# Patient Record
Sex: Male | Born: 1960 | Race: Black or African American | Hispanic: No | Marital: Single | Smoking: Never smoker
Health system: Southern US, Community
[De-identification: ages and names within clinical notes are randomized; demographics above are authoritative.]

## PROBLEM LIST (undated history)

## (undated) DIAGNOSIS — G4730 Sleep apnea, unspecified: Secondary | ICD-10-CM

## (undated) DIAGNOSIS — I10 Essential (primary) hypertension: Secondary | ICD-10-CM

## (undated) DIAGNOSIS — I499 Cardiac arrhythmia, unspecified: Secondary | ICD-10-CM

## (undated) HISTORY — DX: Sleep apnea, unspecified: G47.30

## (undated) HISTORY — DX: Cardiac arrhythmia, unspecified: I49.9

## (undated) HISTORY — DX: Essential (primary) hypertension: I10

## (undated) HISTORY — PX: NO PAST SURGERIES: SHX2092

---

## 1999-08-16 ENCOUNTER — Emergency Department (HOSPITAL_COMMUNITY): Admission: EM | Admit: 1999-08-16 | Discharge: 1999-08-16 | Payer: Self-pay | Admitting: Podiatry

## 1999-11-02 ENCOUNTER — Encounter: Payer: Self-pay | Admitting: Chiropractic Medicine

## 1999-11-02 ENCOUNTER — Ambulatory Visit (HOSPITAL_COMMUNITY): Admission: RE | Admit: 1999-11-02 | Discharge: 1999-11-02 | Payer: Self-pay | Admitting: Chiropractic Medicine

## 2003-01-09 ENCOUNTER — Ambulatory Visit (HOSPITAL_COMMUNITY): Admission: RE | Admit: 2003-01-09 | Discharge: 2003-01-09 | Payer: Self-pay | Admitting: Gastroenterology

## 2003-02-28 ENCOUNTER — Encounter: Payer: Self-pay | Admitting: Emergency Medicine

## 2003-02-28 ENCOUNTER — Emergency Department (HOSPITAL_COMMUNITY): Admission: EM | Admit: 2003-02-28 | Discharge: 2003-02-28 | Payer: Self-pay | Admitting: Emergency Medicine

## 2003-03-17 ENCOUNTER — Ambulatory Visit (HOSPITAL_BASED_OUTPATIENT_CLINIC_OR_DEPARTMENT_OTHER): Admission: RE | Admit: 2003-03-17 | Discharge: 2003-03-17 | Payer: Self-pay | Admitting: Surgery

## 2003-03-17 ENCOUNTER — Encounter (INDEPENDENT_AMBULATORY_CARE_PROVIDER_SITE_OTHER): Payer: Self-pay | Admitting: *Deleted

## 2006-01-31 ENCOUNTER — Emergency Department (HOSPITAL_COMMUNITY): Admission: EM | Admit: 2006-01-31 | Discharge: 2006-01-31 | Payer: Self-pay | Admitting: Emergency Medicine

## 2008-11-06 ENCOUNTER — Emergency Department (HOSPITAL_COMMUNITY): Admission: EM | Admit: 2008-11-06 | Discharge: 2008-11-07 | Payer: Self-pay | Admitting: Emergency Medicine

## 2010-11-19 NOTE — Op Note (Signed)
NAME:  [name], [name]                        ACCOUNT NO.:  [id number]   MEDICAL RECORD NO.:  [id number]                   PATIENT TYPE:  AMB   LOCATION:  DSC                                  FACILITY:  [facility]   PHYSICIAN:  Thornton Park. Daphine Deutscher, M.D.             DATE OF BIRTH:  February 15, 1961   DATE OF PROCEDURE:  03/17/2003  DATE OF DISCHARGE:                                 OPERATIVE REPORT   PREOPERATIVE DIAGNOSIS:  Bleeding hemorrhoids.   POSTOPERATIVE DIAGNOSIS:  Internal hemorrhoids.   PROCEDURE:  Exam under anesthesia with excision of 2 hemorrhoidal columns  and banding of 2 hemorrhoidal columns.   SURGEON:  Thornton Park. Daphine Deutscher, M.D.   ANESTHESIA:  General.   DESCRIPTION OF PROCEDURE:  Angel Lyons is a 50 year old man who has had  principally bleeding hemorrhoids. On examination  in the office he had some  internal hemorrhoids but no prolapsed hemorrhoids. Informed consent was  obtained regarding exam under anesthesia with possible banding or excision  of some internal hemorrhoids.   DESCRIPTION OF PROCEDURE:  The patient was placed in the dorsal lithotomy  position after being put to sleep. No prolapsed or extruded hemorrhoids were  noted. However, on entering the  anus with  the anal retractor at the 7 to 8  o'clock position, there was a large internal hemorrhoid that I had seen  previously; it look kind of friable  and like a strawberry.   There were 2, and I went ahead  and grasped the larger one with a  hemorrhoidal clamp and excised the excess and oversewed this with a running  horizontal mattress suture of 3-0 chromic, then sewing back in with a  running locking suture of 3-0 chromic, tying it to itself. I surveyed the  entire anus, and as  I did this I found another column which was at  the 1  to 2 o'clock position, and this  appeared  to be  amenable to banding and  this was double banded with the vacuum assist banding device. Similarly at  the 4 o'clock position   was another area that I was able to double band.   I went back to the 7 o'clock position  and there was still  some redundancy  in another area kind of between  the 7 and 6 o'clock position. I grasped  that with a hemorrhoidal clamp, excised  the excess tissue  and then  oversewed this internal hemorrhoid with a running horizontal mattress suture  of 3-0 chromic with a running locking suture overlying that. Hemostasis was  maintained.   The area of the perianal region was injected with 0.5% Marcaine. Betadine  ointment was massaged into the  anal canal and a light dressing  was  applied. The patient was taken to the recovery room in satisfactory  condition.      MBM/MEDQ  D:  03/17/2003  T:  03/17/2003  Job:  [id number]  

## 2010-11-19 NOTE — Op Note (Signed)
NAME:  Angel Lyons, DOCKEN                        ACCOUNT NO.:  [id number]   MEDICAL RECORD NO.:  [id number]                   PATIENT TYPE:  AMB   LOCATION:  ENDO                                 FACILITY:  [facility]   PHYSICIAN:  Danise Edge, M.D.                DATE OF BIRTH:  11/02/1960   DATE OF PROCEDURE:  01/09/2003  DATE OF DISCHARGE:                                 OPERATIVE REPORT   PROCEDURES:  1. Esophagogastroduodenoscopy.  2. Colonoscopy.   ENDOSCOPIST:  Verlin Grills, M.D.   INDICATIONS FOR PROCEDURE:  Mr. Flynt Breeze is a 50 year old male born  1961-04-22.  Mr. Ahr has iron deficiency anemia based on a  hemoglobin of 9 grams, low serum iron saturation and low serum ferratin.  Mr. Dosher also has intermittent painless hematochezia.   PREMEDICATION:  Versed 10 mg, Demerol 100 mg.   DESCRIPTION OF PROCEDURE:  Esophagogastroduodenoscopy:  After obtaining  informed consent, Mr. Schroeter was placed in the left lateral decubitus  position.  I administered intravenous Demerol and intravenous Versed to  achieve conscious sedation for the procedure.  The patient's blood pressure,  oxygen saturation and cardiac rhythm were monitored throughout the procedure  and documented in the medical record.   The Olympus video gastroscope was passed through the posterior hypopharynx  into the proximal esophagus without difficulty.  The hypopharynx, larynx and  vocal cords appeared normal.   Esophagoscopy: The proximal, mid and lower segments of the esophageal mucosa  appeared normal.   Gastroscopy: Retroflexed view of the gastric cardia and fundus normal.  The  gastric body, antrum, and pylorus appeared normal.   Duodenoscopy: The duodenal bulb, mid duodenum and distal duodenum appeared  normal.  Small bowel biopsies were not performed.   ASSESSMENT:  Normal esophagogastroduodenoscopy.   DESCRIPTION OF PROCEDURE:  Proctocolonoscopy to the cecum:  Anal  inspection  was normal.  The patient was placed in the left lateral decubitus position.  Digital rectal exam was normal.  The Olympus adult colonoscope was  introduced into the rectum and easily advanced to the cecum.  Colonic  preparation for the exam today was satisfactory.   Rectum normal.  Retroflexed view of the distal rectum reveals large but  nonbleeding internal hemorrhoids in multiple columns which is probably the  source of Mr. Hampshire's hematochezia.   Sigmoid colon and descending colon:  Normal.   Splenic flexure normal.   Transverse colon normal.   Hepatic flexure normal.   Ascending colon normal.   Cecum and ileocecal valve normal.   ASSESSMENT:  Large internal hemorrhoids; otherwise normal proctocolonoscopy  to the cecum.  No active lower gastrointestinal bleeding.   RECOMMENDATIONS:  1. Place Mr. Cargo on iron sulfate 325 mg daily for three months.  2. Check his reticulocyte count, hemoglobin and ferratin at 6 and 12 weeks     to assess his response to oral iron and the  ability to absorb oral iron.  3. He would also be a good candidate for referral to Dr. Danna Hefty     for internal hemorrhoid management.                                               Danise Edge, M.D.    MJ/MEDQ  D:  01/09/2003  T:  01/10/2003  Job:  [id number]   cc:   Donia Guiles, M.D.  [address] [place]  [place] [address]  Fax: [phone]

## 2011-10-13 ENCOUNTER — Encounter (HOSPITAL_BASED_OUTPATIENT_CLINIC_OR_DEPARTMENT_OTHER): Payer: Self-pay

## 2011-10-24 ENCOUNTER — Ambulatory Visit (HOSPITAL_BASED_OUTPATIENT_CLINIC_OR_DEPARTMENT_OTHER): Payer: BC Managed Care – PPO | Attending: Internal Medicine | Admitting: Radiology

## 2011-10-24 VITALS — Ht 71.5 in | Wt 250.0 lb

## 2011-10-24 DIAGNOSIS — I491 Atrial premature depolarization: Secondary | ICD-10-CM | POA: Insufficient documentation

## 2011-10-24 DIAGNOSIS — I4949 Other premature depolarization: Secondary | ICD-10-CM | POA: Insufficient documentation

## 2011-10-24 DIAGNOSIS — G4733 Obstructive sleep apnea (adult) (pediatric): Secondary | ICD-10-CM | POA: Insufficient documentation

## 2011-10-29 DIAGNOSIS — G4733 Obstructive sleep apnea (adult) (pediatric): Secondary | ICD-10-CM

## 2011-10-29 DIAGNOSIS — I4949 Other premature depolarization: Secondary | ICD-10-CM

## 2011-10-29 DIAGNOSIS — I491 Atrial premature depolarization: Secondary | ICD-10-CM

## 2011-10-29 NOTE — Procedures (Signed)
NAME:  Angel Lyons, CENTOLA              ACCOUNT NO.:  [id number]  MEDICAL RECORD NO.:  [id number]          PATIENT TYPE:  OUT  LOCATION:  SLEEP CENTER                 [facility]ACILITY:  [facility]  PHYSICIAN:  Loni Delbridge D. Maple Hudson, MD, FCCP, FACPDATE OF BIRTH:  December 03, 1960  DATE OF STUDY:  10/24/2011                           NOCTURNAL POLYSOMNOGRAM  REFERRING PHYSICIAN:  Margaretmary Bayley, M.D.  REFERRING PHYSICIAN:  Margaretmary Bayley, MD  INDICATION FOR STUDY:  Hypersomnia with sleep apnea.  EPWORTH SLEEPINESS SCORE:  15/24.  BMI 34.4, weight 250 pounds, height 71.5 inches, neck 16.5 inches.  MEDICATIONS:  Home medications are charted and reviewed.  SLEEP ARCHITECTURE:  Split study protocol.  During the diagnostic phase, total sleep time 125 minutes with sleep efficiency 80.1%.  Stage I was 24%, stage II 76%, stages 3 and REM were absent.  Sleep latency 20 minutes, awake after sleep onset 10.5 minutes, and arousal index 99.8.  Bedtime medication:  None.  RESPIRATORY DATA:  Split study protocol.  Apnea-hypopnea index (AHI) 96 per hour.  A total of 200 events was scored including 48 obstructive apneas and 38 mixed apneas with 114 hypopneas.  Events were associated with supine sleep position.  CPAP was then titrated to 19 CWP, AHI 0 per hour wore.  He wore a medium ResMed Mirage Quattro full face mask with heated humidifier and a C-Flex setting of 3.  OXYGEN DATA:  Before CPAP, snoring was moderately loud with oxygen desaturation to a nadir of 71% on room air.  With CPAP titration, snoring was prevented and mean oxygen saturation held 91.8% on room air.  CARDIAC DATA:  Sinus rhythm with frequent PACs and multifocal PVCs.  MOVEMENT-PARASOMNIA:  No significant movement disturbance.  No bathroom trips.  IMPRESSIONS-RECOMMENDATION: 1. Severe obstructive sleep apnea/hypopnea syndrome, apnea-hypopnea     index 96 per hour with supine events.  Moderately loud snoring with     oxygen desaturation to a nadir  of 71% on room air. 2. Successful continuous positive airway pressure titration to 19 cm     of water pressure, apnea-hypopnea index 0 per hour.  He wore a     medium ResMed Mirage Quattro full face mask with heated humidifier     and a C-Flex     setting of 3. 3. Frequent premature atrial contractions and multifocal premature     ventricular contractions.     Ormand Senn D. Maple Hudson, MD, [facility], FACP Diplomate, American Board of Sleep Medicine    CDY/MEDQ  D:  10/29/2011 12:42:55  T:  10/29/2011 13:27:24  Job:  [id number]

## 2012-10-03 ENCOUNTER — Institutional Professional Consult (permissible substitution): Payer: BC Managed Care – PPO | Admitting: Pulmonary Disease

## 2012-10-05 ENCOUNTER — Encounter: Payer: Self-pay | Admitting: Pulmonary Disease

## 2012-10-11 ENCOUNTER — Telehealth: Payer: Self-pay

## 2012-10-11 NOTE — Telephone Encounter (Signed)
I spoke with Marcelino Duster. [facility] had an opening tomorrow at 11:15. Pt is scheduled for that time. She is aware pt to arrive 15 minutes early. She is aware of location. She will fax paperwork to triage fax #. Nothing further was needed

## 2012-10-12 ENCOUNTER — Ambulatory Visit (INDEPENDENT_AMBULATORY_CARE_PROVIDER_SITE_OTHER): Payer: Private Health Insurance - Indemnity | Admitting: Pulmonary Disease

## 2012-10-12 ENCOUNTER — Encounter: Payer: Self-pay | Admitting: Pulmonary Disease

## 2012-10-12 VITALS — BP 130/88 | HR 97 | Temp 98.3°F | Ht 71.5 in | Wt 244.2 lb

## 2012-10-12 DIAGNOSIS — G4733 Obstructive sleep apnea (adult) (pediatric): Secondary | ICD-10-CM

## 2012-10-12 NOTE — Progress Notes (Signed)
Subjective:    Patient ID: Angel Lyons, male    DOB: 13-Feb-1961, 52 y.o.   MRN: [id number]  HPI The patient is a 52 year old male who been asked to see for management of obstructive sleep apnea.  He was diagnosed with very severe sleep apnea in 2013, with an AHI of 96 events per hour and oxygen desaturation as low as 71%.  He was titrated on CPAP to an optimal pressure of 19 cm of water.  Currently, the patient states that he does have loud snoring, and has been told that he has an abnormal breathing pattern during sleep.  He also notes occasional gasping arousals.  He has frequent awakenings at night, and has not rested in the mornings upon arising.  He notes definite sleep pressure during the day with inactivity, and falls asleep easily in the evenings watching television.  He also notes some sleepiness with driving long distances.  The patient's weight is down 20 pounds over the last 2 years, and his epworth score today is 10.   Sleep Questionnaire What time do you typically go to bed?( Between what hours) 9p-11p ------sometime works split shift > into work at [employer] back in at 1pm-3:30p 9p-11p ------sometime works split shift > into work at [employer] back in at 1pm-3:30p at 1121 on 10/12/12 by Nita Sells, CMA How long does it take you to fall asleep? 5-56mins 5-72mins at 1121 on 10/12/12 by Nita Sells, CMA How many times during the night do you wake up? 2 2 at 1121 on 10/12/12 by Nita Sells, CMA What time do you get out of bed to start your day? 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 (In pounds) 20 lb (9.072 kg)20 lb (9.072 kg) decrease at 1121 on 10/12/12 by Nita Sells, CMA Have you ever had a sleep study before? Yes Yes at 1121 on  10/12/12 by Nita Sells, CMA If yes, location of study? [facility] at 1121 on 10/12/12 by Nita Sells, CMA If yes, date of study? 10/24/2011 10/24/2011 at 1121 on 10/12/12 by Nita Sells, CMA Do you currently use CPAP? No No at 1121 on 10/12/12 by Marjo Bicker Mabe, CMA Do you wear oxygen at any time? No No at 1121 on 10/12/12 by Marjo Bicker Mabe, CMA   Review of Systems  Constitutional: Negative for fever and unexpected weight change.  HENT: Positive for congestion, rhinorrhea and postnasal drip. Negative for ear pain, nosebleeds, sore throat, sneezing, trouble swallowing, dental problem and sinus pressure.   Eyes: Negative for redness and itching.  Respiratory: Positive for cough and shortness of breath ( happens with irreg heartbeats//palps). Negative for chest tightness and wheezing.   Cardiovascular: Positive for palpitations ( causing SOB). Negative for leg swelling.  Gastrointestinal: Negative for nausea and vomiting.  Genitourinary: Negative for dysuria.  Musculoskeletal: Negative for joint swelling.  Skin: Negative for rash.  Neurological: Negative for headaches.  Hematological: Does not bruise/bleed easily.  Psychiatric/Behavioral: Negative for dysphoric mood. The patient is not nervous/anxious.        Objective:   Physical Exam Constitutional: overweight male,  no acute distress  HENT:  Nares patent without discharge  Oropharynx without exudate, palate and uvula are moderately elongated.   Eyes:  Perrla, eomi, no scleral icterus  Neck:  No JVD, no TMG  Cardiovascular:  Normal rate, regular rhythm, no rubs or gallops.  No murmurs        Intact distal pulses  Pulmonary :  Normal breath sounds, no stridor or respiratory distress   No rales, rhonchi, or wheezing  Abdominal:  Soft, nondistended, bowel sounds present.  No tenderness noted.   Musculoskeletal:  No lower extremity edema noted.  Lymph Nodes:  No cervical lymphadenopathy noted  Skin:  No  cyanosis noted  Neurologic:  Alert, appropriate, moves all 4 extremities without obvious deficit.         Assessment & Plan:

## 2012-10-12 NOTE — Assessment & Plan Note (Signed)
The patient has been diagnosed with very severe obstructive sleep apnea, and will need aggressive treatment while he is working on weight loss.  I had long discussion with him about sleep apnea, including its impact to his quality of life and cardiovascular health.  I would like to start him on CPAP and a moderate pressure level, and then gradually increase him to his optimal level.  I also stressed to him the importance of weight loss.

## 2012-10-12 NOTE — Patient Instructions (Addendum)
Will get you started on cpap at a moderate level.  Please call me if you are having issues with tolerance. Work on weight loss followup with me in 6 weeks.

## 2016-02-07 ENCOUNTER — Encounter (HOSPITAL_COMMUNITY): Payer: Self-pay | Admitting: *Deleted

## 2016-02-07 ENCOUNTER — Emergency Department (HOSPITAL_COMMUNITY)
Admission: EM | Admit: 2016-02-07 | Discharge: 2016-02-07 | Disposition: A | Discharge status: Home / Self Care | Payer: Private Health Insurance - Indemnity | Attending: Emergency Medicine | Admitting: Emergency Medicine

## 2016-02-07 DIAGNOSIS — I10 Essential (primary) hypertension: Secondary | ICD-10-CM | POA: Insufficient documentation

## 2016-02-07 DIAGNOSIS — R519 Headache, unspecified: Secondary | ICD-10-CM

## 2016-02-07 DIAGNOSIS — Z79899 Other long term (current) drug therapy: Secondary | ICD-10-CM | POA: Insufficient documentation

## 2016-02-07 DIAGNOSIS — R51 Headache: Secondary | ICD-10-CM | POA: Insufficient documentation

## 2016-02-07 MED ORDER — ACETAMINOPHEN 325 MG PO TABS
975.0000 mg | ORAL_TABLET | Freq: Once | ORAL | Status: AC
  Administered 2016-02-07: 975 mg via ORAL
  Filled 2016-02-07: qty 3

## 2016-02-07 MED ORDER — IBUPROFEN 400 MG PO TABS
800.0000 mg | ORAL_TABLET | Freq: Once | ORAL | Status: AC
  Administered 2016-02-07: 800 mg via ORAL
  Filled 2016-02-07: qty 2

## 2016-02-07 NOTE — ED Triage Notes (Signed)
Pt reports having a headache this and is requesting tylenol, reports being homeless and unable to afford tylenol. Denies n/v.

## 2016-02-07 NOTE — ED Notes (Signed)
C/O BILATERAL TEMPORAL HEADACHE, ONSET THIS AM. DENIES N/V OR VISUAL DISTURBANCES. STATES IS HOMELESS AND DOESN'T HAVE MONEY TO BUY TYLENOL.

## 2016-02-07 NOTE — ED Provider Notes (Signed)
[facility]EMERGENCY DEPT Provider Note   CSN: [id number] Arrival date & time: 02/07/16  1206  First Provider Contact: First MD Initiated Contact with Patient 02/07/16 1213    By signing my name below, I, Rosario Adie, attest that this documentation has been prepared under the direction and in the presence of [employer], PA-C.  Electronically Signed: Rosario Adie, ED Scribe. 02/07/16. 12:23 PM.  History   Chief Complaint Chief Complaint  Patient presents with  . Headache   HPI HPI Comments: Angel Lyons is a 55 y.o. male who is homeless, with a PMHx significant of HTN, who presents to the Emergency Department complaining of gradual onset, unchanged, constant, 8/10 bilateral temporal headache onset this AM PTA. Pt reports to the ED asking for Tylenol, and states that he cannot afford any due to homelessness. No exacerbated or alleviating factors noted. Denies nausea, vomiting, fever, neck pain, balance issues, gait abnormalities or any other associated symptoms.  Past Medical History:  Diagnosis Date  . Abnormal heart rhythm   . HTN (hypertension)   . Sleep apnea    Patient Active Problem List   Diagnosis Date Noted  . OSA (obstructive sleep apnea) 10/12/2012   Past Surgical History:  Procedure Laterality Date  . NO PAST SURGERIES      Home Medications    Prior to Admission medications   Medication Sig Start Date End Date Taking? Authorizing Provider  amLODipine-olmesartan (AZOR) 5-40 MG per tablet Take 1 tablet by mouth daily.    Historical Provider, MD   Family History No family history on file.  Social History Social History  Substance Use Topics  . Smoking status: Never Smoker  . Smokeless tobacco: Not on file  . Alcohol use No   Allergies   Review of patient's allergies indicates not on file.  Review of Systems Review of Systems A complete 10 system review of systems was obtained and all systems are negative except as noted in the HPI and  PMH.   Physical Exam Updated Vital Signs BP 149/91 (BP Location: Left Arm)   Pulse 65   Temp 98.7 F (37.1 C) (Oral)   Resp 22   SpO2 100%   Physical Exam  Constitutional: He is oriented to person, place, and time. He appears well-developed and well-nourished.  HENT:  Head: Normocephalic and atraumatic.  Mouth/Throat: Oropharynx is clear and moist.  Eyes: Conjunctivae and EOM are normal. Pupils are equal, round, and reactive to light.  No TTP of maxillary or frontal sinuses  No TTP or induration of temporal arteries bilaterally  Neck: Normal range of motion. Neck supple.  FROM to C-spine. Pt can touch chin to chest without discomfort. No TTP of midline cervical spine.   Cardiovascular: Normal rate, regular rhythm and intact distal pulses.   Pulmonary/Chest: Effort normal and breath sounds normal. No respiratory distress. He has no wheezes. He has no rales. He exhibits no tenderness.  Abdominal: Soft. Bowel sounds are normal. He exhibits no distension. There is no tenderness.  Musculoskeletal: Normal range of motion. He exhibits no edema or tenderness.  Neurological: He is alert and oriented to person, place, and time. No cranial nerve deficit.  II-Visual fields grossly intact. III/IV/VI-Extraocular movements intact.  Pupils reactive bilaterally. V/VII-Smile symmetric, equal eyebrow raise,  facial sensation intact VIII- Hearing grossly intact IX/X-Normal gag XI-bilateral shoulder shrug XII-midline tongue extension Motor: 5/5 bilaterally with normal tone and bulk Cerebellar: Normal finger-to-nose  and normal heel-to-shin test.   Romberg negative Ambulates with a  coordinated gait   Skin: Skin is warm and dry.  Psychiatric: He has a normal mood and affect. His behavior is normal.  Nursing note and vitals reviewed.  ED Treatments / Results  DIAGNOSTIC STUDIES: Oxygen Saturation is 100% on RA, normal by my interpretation.   COORDINATION OF CARE: 12:23 PM-Discussed next  steps with pt. Pt verbalized understanding and is agreeable with the plan.   Procedures Procedures   Medications Ordered in ED Medications  acetaminophen (TYLENOL) tablet 975 mg (975 mg Oral Given 02/07/16 1231)  ibuprofen (ADVIL,MOTRIN) tablet 800 mg (800 mg Oral Given 02/07/16 1231)   Initial Impression / Assessment and Plan / ED Course  I have reviewed the triage vital signs and the nursing notes.  Pertinent labs & imaging results that were available during my care of the patient were reviewed by me and considered in my medical decision making (see chart for details).  Clinical Course    Vitals:   02/07/16 1212  BP: 149/91  Pulse: 65  Resp: 22  Temp: 98.7 F (37.1 C)  TempSrc: Oral  SpO2: 100%    Medications  acetaminophen (TYLENOL) tablet 975 mg (975 mg Oral Given 02/07/16 1231)  ibuprofen (ADVIL,MOTRIN) tablet 800 mg (800 mg Oral Given 02/07/16 1231)    Angel Lyons is 55 y.o. male presenting with Headache, normal neuro exam, no red flags. Patient states that he is homeless and cannot afford over-the-counter medications.  Evaluation does not show pathology that would require ongoing emergent intervention or inpatient treatment. Pt is hemodynamically stable and mentating appropriately. Discussed findings and plan with patient/guardian, who agrees with care plan. All questions answered. Return precautions discussed and outpatient follow up given.      Final Clinical Impressions(s) / ED Diagnoses   Final diagnoses:  Acute nonintractable headache, unspecified headache type    New Prescriptions New Prescriptions   No medications on file   I personally performed the services described in this documentation, which was scribed in my presence. The recorded information has been reviewed and is accurate.     Wynetta Emeryicole Burlin Mcnair, PA-C 02/07/16 1302    Vanetta MuldersScott Zackowski, MD 02/07/16 1455

## 2016-02-07 NOTE — Discharge Instructions (Signed)
Please follow with your primary care doctor in the next 2 days for a check-up. They must obtain records for further management.  ° °Do not hesitate to return to the Emergency Department for any new, worsening or concerning symptoms.  ° °

## 2016-02-25 ENCOUNTER — Encounter (HOSPITAL_COMMUNITY): Payer: Self-pay | Admitting: Emergency Medicine

## 2016-02-25 ENCOUNTER — Emergency Department (HOSPITAL_COMMUNITY)
Admission: EM | Admit: 2016-02-25 | Discharge: 2016-02-25 | Disposition: A | Discharge status: Home / Self Care | Payer: BLUE CROSS/BLUE SHIELD | Attending: Emergency Medicine | Admitting: Emergency Medicine

## 2016-02-25 DIAGNOSIS — I10 Essential (primary) hypertension: Secondary | ICD-10-CM | POA: Diagnosis not present

## 2016-02-25 DIAGNOSIS — S8991XA Unspecified injury of right lower leg, initial encounter: Secondary | ICD-10-CM | POA: Diagnosis present

## 2016-02-25 DIAGNOSIS — Y999 Unspecified external cause status: Secondary | ICD-10-CM | POA: Insufficient documentation

## 2016-02-25 DIAGNOSIS — Y929 Unspecified place or not applicable: Secondary | ICD-10-CM | POA: Insufficient documentation

## 2016-02-25 DIAGNOSIS — S8391XA Sprain of unspecified site of right knee, initial encounter: Secondary | ICD-10-CM | POA: Diagnosis not present

## 2016-02-25 DIAGNOSIS — X509XXA Other and unspecified overexertion or strenuous movements or postures, initial encounter: Secondary | ICD-10-CM | POA: Insufficient documentation

## 2016-02-25 DIAGNOSIS — Y939 Activity, unspecified: Secondary | ICD-10-CM | POA: Insufficient documentation

## 2016-02-25 DIAGNOSIS — S43421A Sprain of right rotator cuff capsule, initial encounter: Secondary | ICD-10-CM | POA: Insufficient documentation

## 2016-02-25 MED ORDER — IBUPROFEN 400 MG PO TABS
600.0000 mg | ORAL_TABLET | Freq: Once | ORAL | Status: AC
  Administered 2016-02-25: 600 mg via ORAL
  Filled 2016-02-25: qty 1

## 2016-02-25 NOTE — ED Provider Notes (Signed)
[facility] Provider Note   CSN: [id number] Arrival date & time: 02/25/16  [id number]     History   Chief Complaint Chief Complaint  Patient presents with  . Shoulder Pain  . Knee Pain    HPI Angel Lyons is a 55 y.o. male.  HPI  55 y.o. male with a hx of HTN, presents to the Emergency Department today complaining of right shoulder pain and right knee pain. Notes right shoulder pain with onset yesterday. 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 No trauma to area. No falls. As patient was exiting bus, his right knee "buckled"  With no trauma or falls. Notes pain 9/10 on both. Has not tried any OTC remedies. No N/V. No fevers. No other symptoms noted.   Past Medical History:  Diagnosis Date  . Abnormal heart rhythm   . HTN (hypertension)   . Sleep apnea     Patient Active Problem List   Diagnosis Date Noted  . OSA (obstructive sleep apnea) 10/12/2012    Past Surgical History:  Procedure Laterality Date  . NO PAST SURGERIES       Home Medications    Prior to Admission medications   Medication Sig Start Date End Date Taking? Authorizing Provider  amLODipine-olmesartan (AZOR) 5-40 MG per tablet Take 1 tablet by mouth daily.    Historical Provider, MD    Family History No family history on file.  Social History Social History  Substance Use Topics  . Smoking status: Never Smoker  . Smokeless tobacco: Never Used  . Alcohol use No     Allergies   Review of patient's allergies indicates no known allergies.   Review of Systems Review of Systems  Constitutional: Negative for fever.  Gastrointestinal: Negative for nausea.  Musculoskeletal: Positive for arthralgias.   Physical Exam Updated Vital Signs BP 148/86 (BP Location: Right Arm)   Pulse 88   Temp 98 F (36.7 C)   Resp 19   Ht 5' 11.5" (1.816 m)   Wt 105.2 kg   SpO2 97%   BMI 31.91 kg/m   Physical Exam  Constitutional: He is oriented to person, place, and time.  Vital signs are normal. He appears well-developed and well-nourished.  HENT:  Head: Normocephalic.  Right Ear: Hearing normal.  Left Ear: Hearing normal.  Eyes: Conjunctivae and EOM are normal. Pupils are equal, round, and reactive to light.  Cardiovascular: Normal rate and regular rhythm.   Pulmonary/Chest: Effort normal.  Musculoskeletal:       Right shoulder: Normal. He exhibits normal range of motion.       Right knee: Normal.  Right Shoulder Negative hawkins test, negative Neer's test, no TTP over shoulder or elbow. No pain with flexion/extension/abduction/adduction internal or external rotation. No obvious bony deformity. Right knee Negative anterior/poster drawer bilaterally. Negative ballottement test. No varus or valgus laxity. No crepitus. No pain with flexion or extension. No TTP of knees or ankles.   Neurological: He is alert and oriented to person, place, and time.  Skin: Skin is warm and dry.  Psychiatric: He has a normal mood and affect. His speech is normal and behavior is normal. Thought content normal.   ED Treatments / Results  Labs (all labs ordered are listed, but only abnormal results are displayed) Labs Reviewed - No data to display  EKG  EKG Interpretation None      Radiology No results found.  Procedures Procedures (including critical care time)  Medications Ordered in ED Medications -  No data to display   Initial Impression / Assessment and Plan / ED Course  I have reviewed the triage vital signs and the nursing notes.  Pertinent labs & imaging results that were available during my care of the patient were reviewed by me and considered in my medical decision making (see chart for details).  Clinical Course    Final Clinical Impressions(s) / ED Diagnoses  I have reviewed the relevant previous healthcare records. I obtained HPI from historian.  ED Course:  Assessment: Pt is a 55yM who presents with right shoulder and right knee pain. On  exam, pt in NAD. Nontoxic/nonseptic appearing. VSS. Afebrile. Likely sprains of respective areas. No trauma or fall to areas. Pain only with movement. Will DC with knee sleeve for sprain of knee and counseled on rotator cuff exercises. Counseled on NSAID use. Imaging not warranted. I do not suspect fracture. Plan is to DC home with follow up to PCP. At time of discharge, Patient is in no acute distress. Vital Signs are stable. Patient is able to ambulate. Patient able to tolerate PO.   Disposition/Plan:  DC Home Additional Verbal discharge instructions given and discussed with patient.  Pt Instructed to f/u with PCP in the next week for evaluation and treatment of symptoms. Return precautions given Pt acknowledges and agrees with plan  Supervising Physician Tomasita CrumbleAdeleke Oni, MD   Final diagnoses:  Rotator cuff sprain, right, initial encounter  Right knee sprain, initial encounter    New Prescriptions New Prescriptions   No medications on file     Audry Piliyler Lauriann Milillo, PA-C 02/25/16 0446    Tomasita CrumbleAdeleke Oni, MD 02/25/16 [phone]
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
## 2016-02-27 ENCOUNTER — Emergency Department (HOSPITAL_COMMUNITY): Payer: BLUE CROSS/BLUE SHIELD

## 2016-02-27 ENCOUNTER — Encounter (HOSPITAL_COMMUNITY): Payer: Self-pay | Admitting: Emergency Medicine

## 2016-02-27 ENCOUNTER — Emergency Department (HOSPITAL_COMMUNITY)
Admission: EM | Admit: 2016-02-27 | Discharge: 2016-02-27 | Disposition: A | Discharge status: Home / Self Care | Payer: BLUE CROSS/BLUE SHIELD | Attending: Emergency Medicine | Admitting: Emergency Medicine

## 2016-02-27 DIAGNOSIS — M25561 Pain in right knee: Secondary | ICD-10-CM | POA: Insufficient documentation

## 2016-02-27 DIAGNOSIS — I10 Essential (primary) hypertension: Secondary | ICD-10-CM | POA: Diagnosis not present

## 2016-02-27 DIAGNOSIS — Y939 Activity, unspecified: Secondary | ICD-10-CM | POA: Insufficient documentation

## 2016-02-27 DIAGNOSIS — S46911A Strain of unspecified muscle, fascia and tendon at shoulder and upper arm level, right arm, initial encounter: Secondary | ICD-10-CM

## 2016-02-27 DIAGNOSIS — Y999 Unspecified external cause status: Secondary | ICD-10-CM | POA: Diagnosis not present

## 2016-02-27 DIAGNOSIS — S4991XA Unspecified injury of right shoulder and upper arm, initial encounter: Secondary | ICD-10-CM | POA: Diagnosis present

## 2016-02-27 DIAGNOSIS — X58XXXA Exposure to other specified factors, initial encounter: Secondary | ICD-10-CM | POA: Diagnosis not present

## 2016-02-27 DIAGNOSIS — Y929 Unspecified place or not applicable: Secondary | ICD-10-CM | POA: Diagnosis not present

## 2016-02-27 DIAGNOSIS — S46011A Strain of muscle(s) and tendon(s) of the rotator cuff of right shoulder, initial encounter: Secondary | ICD-10-CM | POA: Insufficient documentation

## 2016-02-27 MED ORDER — ACETAMINOPHEN 500 MG PO TABS
1000.0000 mg | ORAL_TABLET | Freq: Once | ORAL | Status: AC
  Administered 2016-02-27: 1000 mg via ORAL
  Filled 2016-02-27: qty 2

## 2016-02-27 MED ORDER — IBUPROFEN 800 MG PO TABS
800.0000 mg | ORAL_TABLET | Freq: Once | ORAL | Status: AC
  Administered 2016-02-27: 800 mg via ORAL
  Filled 2016-02-27: qty 1
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
## 2016-03-02 ENCOUNTER — Encounter (HOSPITAL_COMMUNITY): Payer: Self-pay | Admitting: Emergency Medicine

## 2016-03-02 ENCOUNTER — Emergency Department (HOSPITAL_COMMUNITY)
Admission: EM | Admit: 2016-03-02 | Discharge: 2016-03-02 | Disposition: A | Discharge status: Home / Self Care | Payer: BLUE CROSS/BLUE SHIELD | Attending: Emergency Medicine | Admitting: Emergency Medicine

## 2016-03-02 DIAGNOSIS — Y929 Unspecified place or not applicable: Secondary | ICD-10-CM | POA: Insufficient documentation

## 2016-03-02 DIAGNOSIS — S46911D Strain of unspecified muscle, fascia and tendon at shoulder and upper arm level, right arm, subsequent encounter: Secondary | ICD-10-CM

## 2016-03-02 DIAGNOSIS — I10 Essential (primary) hypertension: Secondary | ICD-10-CM | POA: Insufficient documentation

## 2016-03-02 DIAGNOSIS — M25561 Pain in right knee: Secondary | ICD-10-CM | POA: Diagnosis not present

## 2016-03-02 DIAGNOSIS — M25511 Pain in right shoulder: Secondary | ICD-10-CM | POA: Diagnosis not present

## 2016-03-02 DIAGNOSIS — Y939 Activity, unspecified: Secondary | ICD-10-CM | POA: Diagnosis not present

## 2016-03-02 DIAGNOSIS — Y990 Civilian activity done for income or pay: Secondary | ICD-10-CM | POA: Diagnosis not present

## 2016-03-02 DIAGNOSIS — X58XXXA Exposure to other specified factors, initial encounter: Secondary | ICD-10-CM | POA: Diagnosis not present

## 2016-03-02 MED ORDER — IBUPROFEN 800 MG PO TABS
800.0000 mg | ORAL_TABLET | Freq: Once | ORAL | Status: AC
  Administered 2016-03-02: 800 mg via ORAL
  Filled 2016-03-02: qty 1

## 2016-03-02 NOTE — ED Notes (Signed)
Pt provided with d/c instructions at this time. Pt verbalizes understanding of d/c instructions as well as follow up procedure after d/c. No new RX at time of d/c.  Pt ambulatory at time of d/c. Pt in no apparent distress at this time.   

## 2016-03-02 NOTE — ED Triage Notes (Signed)
Pt reports that he injured his right shoulder and knee while working on his job.  Pt reports no relief of symptoms since then.   Chief Complaint  Patient presents with  . Knee Pain  . Shoulder Pain   Past Medical History:  Diagnosis Date  . Abnormal heart rhythm   . HTN (hypertension)   . Sleep apnea
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
## 2016-03-03 ENCOUNTER — Encounter (HOSPITAL_COMMUNITY): Payer: Self-pay | Admitting: *Deleted

## 2016-03-03 ENCOUNTER — Emergency Department (HOSPITAL_COMMUNITY)
Admission: EM | Admit: 2016-03-03 | Discharge: 2016-03-03 | Disposition: A | Discharge status: Home / Self Care | Payer: BLUE CROSS/BLUE SHIELD | Attending: Dermatology | Admitting: Dermatology

## 2016-03-03 DIAGNOSIS — Z5321 Procedure and treatment not carried out due to patient leaving prior to being seen by health care provider: Secondary | ICD-10-CM | POA: Diagnosis not present

## 2016-03-03 DIAGNOSIS — I10 Essential (primary) hypertension: Secondary | ICD-10-CM | POA: Diagnosis not present

## 2016-03-03 DIAGNOSIS — M25561 Pain in right knee: Secondary | ICD-10-CM | POA: Insufficient documentation

## 2016-03-03 DIAGNOSIS — Y929 Unspecified place or not applicable: Secondary | ICD-10-CM | POA: Diagnosis not present

## 2016-03-03 DIAGNOSIS — X58XXXA Exposure to other specified factors, initial encounter: Secondary | ICD-10-CM | POA: Insufficient documentation

## 2016-03-03 DIAGNOSIS — Y990 Civilian activity done for income or pay: Secondary | ICD-10-CM | POA: Diagnosis not present

## 2016-03-03 DIAGNOSIS — M25511 Pain in right shoulder: Secondary | ICD-10-CM | POA: Insufficient documentation

## 2016-03-03 DIAGNOSIS — Y939 Activity, unspecified: Secondary | ICD-10-CM | POA: Diagnosis not present

## 2016-03-03 NOTE — ED Triage Notes (Signed)
Pt c/o R knee pain and R shoulder pain for over a week. Pt ambulatory with steady gait. Reports injuring it at work. Pt has been seen for same. Pt has been taking ibuprofen without relief

## 2016-03-10 ENCOUNTER — Encounter (HOSPITAL_COMMUNITY): Payer: Self-pay | Admitting: Emergency Medicine

## 2016-03-10 ENCOUNTER — Emergency Department (HOSPITAL_COMMUNITY)
Admission: EM | Admit: 2016-03-10 | Discharge: 2016-03-10 | Disposition: A | Discharge status: Home / Self Care | Payer: BLUE CROSS/BLUE SHIELD | Attending: Emergency Medicine | Admitting: Emergency Medicine

## 2016-03-10 DIAGNOSIS — M25511 Pain in right shoulder: Secondary | ICD-10-CM | POA: Diagnosis not present

## 2016-03-10 DIAGNOSIS — I10 Essential (primary) hypertension: Secondary | ICD-10-CM | POA: Insufficient documentation

## 2016-03-10 DIAGNOSIS — Y990 Civilian activity done for income or pay: Secondary | ICD-10-CM | POA: Insufficient documentation

## 2016-03-10 DIAGNOSIS — Y939 Activity, unspecified: Secondary | ICD-10-CM | POA: Diagnosis not present

## 2016-03-10 DIAGNOSIS — M25561 Pain in right knee: Secondary | ICD-10-CM

## 2016-03-10 DIAGNOSIS — X500XXA Overexertion from strenuous movement or load, initial encounter: Secondary | ICD-10-CM | POA: Diagnosis not present

## 2016-03-10 DIAGNOSIS — Y929 Unspecified place or not applicable: Secondary | ICD-10-CM | POA: Diagnosis not present

## 2016-03-10 MED ORDER — IBUPROFEN 400 MG PO TABS
400.0000 mg | ORAL_TABLET | Freq: Once | ORAL | Status: AC
  Administered 2016-03-10: 400 mg via ORAL
  Filled 2016-03-10: qty 1

## 2016-03-10 MED ORDER — IBUPROFEN 400 MG PO TABS: 400.0000 mg | ORAL_TABLET | Freq: Four times a day (QID) | ORAL | 0 refills | Status: DC | PRN
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
## 2016-03-10 NOTE — Discharge Instructions (Signed)
You were seen today for persistent knee and shoulder pain.  It is an inappropriate use of the emergency department to return for pain medication refills. If you need help and assistance obtaining her medications as an outpatient, you can call back to the emergency room during daytime hours to discuss with our case management.

## 2016-03-11 ENCOUNTER — Encounter (HOSPITAL_COMMUNITY): Payer: Self-pay | Admitting: *Deleted

## 2016-03-11 ENCOUNTER — Emergency Department (HOSPITAL_COMMUNITY)
Admission: EM | Admit: 2016-03-11 | Discharge: 2016-03-12 | Disposition: A | Discharge status: Home / Self Care | Payer: BLUE CROSS/BLUE SHIELD | Attending: Emergency Medicine | Admitting: Emergency Medicine

## 2016-03-11 DIAGNOSIS — M25561 Pain in right knee: Secondary | ICD-10-CM | POA: Diagnosis not present

## 2016-03-11 DIAGNOSIS — M25511 Pain in right shoulder: Secondary | ICD-10-CM

## 2016-03-11 DIAGNOSIS — I10 Essential (primary) hypertension: Secondary | ICD-10-CM | POA: Diagnosis not present
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
## 2016-03-11 NOTE — ED Triage Notes (Signed)
The pt is c/o rt shoulder and rt knee pain for 2 weeks  He was seen here one week ago for the same

## 2016-03-12 MED ORDER — IBUPROFEN 800 MG PO TABS: 800.0000 mg | ORAL_TABLET | Freq: Three times a day (TID) | ORAL | 0 refills | Status: DC

## 2016-03-12 MED ORDER — IBUPROFEN 400 MG PO TABS
800.0000 mg | ORAL_TABLET | Freq: Once | ORAL | Status: AC
  Administered 2016-03-12: 800 mg via ORAL
  Filled 2016-03-12: qty 2

## 2016-03-12 NOTE — Discharge Instructions (Signed)
Take the prescribed medication as directed-- [facility] Follow-up with the orthopedist as soon as possible. Return to the ED for new or worsening symptoms.

## 2017-01-20 ENCOUNTER — Encounter (HOSPITAL_COMMUNITY): Payer: Self-pay | Admitting: Emergency Medicine

## 2017-01-20 ENCOUNTER — Emergency Department (HOSPITAL_COMMUNITY)
Admission: EM | Admit: 2017-01-20 | Discharge: 2017-01-21 | Disposition: A | Discharge status: Home / Self Care | Payer: Worker's Compensation | Attending: Emergency Medicine | Admitting: Emergency Medicine

## 2017-01-20 DIAGNOSIS — M549 Dorsalgia, unspecified: Secondary | ICD-10-CM | POA: Insufficient documentation

## 2017-01-20 DIAGNOSIS — Y939 Activity, unspecified: Secondary | ICD-10-CM | POA: Diagnosis not present

## 2017-01-20 DIAGNOSIS — Y999 Unspecified external cause status: Secondary | ICD-10-CM | POA: Diagnosis not present

## 2017-01-20 DIAGNOSIS — Z79899 Other long term (current) drug therapy: Secondary | ICD-10-CM | POA: Insufficient documentation

## 2017-01-20 DIAGNOSIS — I10 Essential (primary) hypertension: Secondary | ICD-10-CM | POA: Diagnosis not present

## 2017-01-20 DIAGNOSIS — Y92410 Unspecified street and highway as the place of occurrence of the external cause: Secondary | ICD-10-CM | POA: Diagnosis not present

## 2017-01-20 MED ORDER — NAPROXEN 500 MG PO TABS
500.0000 mg | ORAL_TABLET | Freq: Two times a day (BID) | ORAL | 0 refills | Status: DC
Start: 2017-01-20 — End: 2022-07-20

## 2017-01-20 MED ORDER — METHOCARBAMOL 500 MG PO TABS: 500.0000 mg | ORAL_TABLET | Freq: Every evening | ORAL | 0 refills | Status: DC | PRN
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
## 2017-01-20 NOTE — ED Notes (Signed)
Patient is A&Ox4 at this time.  Patient in no signs of distress.  Please see providers note for complete history and physical exam.  
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
## 2017-01-21 NOTE — ED Notes (Signed)
Patient Alert and oriented X4. Stable and ambulatory. Patient verbalized understanding of the discharge instructions.  Patient belongings were taken by the patient.  

## 2017-02-03 ENCOUNTER — Ambulatory Visit: Payer: Self-pay

## 2017-02-03 ENCOUNTER — Other Ambulatory Visit: Payer: Self-pay | Admitting: Occupational Medicine

## 2017-02-03 DIAGNOSIS — M545 Low back pain: Secondary | ICD-10-CM
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
## 2021-06-21 ENCOUNTER — Ambulatory Visit: Payer: Self-pay | Admitting: Nurse Practitioner

## 2022-07-20 ENCOUNTER — Observation Stay (HOSPITAL_BASED_OUTPATIENT_CLINIC_OR_DEPARTMENT_OTHER): Payer: Commercial Managed Care - PPO

## 2022-07-20 ENCOUNTER — Inpatient Hospital Stay (HOSPITAL_COMMUNITY)
Admission: EM | Admit: 2022-07-20 | Discharge: 2022-07-23 | DRG: 286 | Disposition: A | Discharge status: Home / Self Care | Payer: Commercial Managed Care - PPO | Attending: Interventional Cardiology | Admitting: Interventional Cardiology

## 2022-07-20 ENCOUNTER — Emergency Department (HOSPITAL_COMMUNITY): Payer: Commercial Managed Care - PPO

## 2022-07-20 ENCOUNTER — Encounter (HOSPITAL_COMMUNITY): Payer: Self-pay

## 2022-07-20 ENCOUNTER — Other Ambulatory Visit: Payer: Self-pay

## 2022-07-20 DIAGNOSIS — I5041 Acute combined systolic (congestive) and diastolic (congestive) heart failure: Secondary | ICD-10-CM | POA: Diagnosis present

## 2022-07-20 DIAGNOSIS — I4892 Unspecified atrial flutter: Secondary | ICD-10-CM | POA: Diagnosis present

## 2022-07-20 DIAGNOSIS — R079 Chest pain, unspecified: Secondary | ICD-10-CM

## 2022-07-20 DIAGNOSIS — R7989 Other specified abnormal findings of blood chemistry: Secondary | ICD-10-CM

## 2022-07-20 DIAGNOSIS — I471 Supraventricular tachycardia, unspecified: Principal | ICD-10-CM

## 2022-07-20 DIAGNOSIS — I493 Ventricular premature depolarization: Secondary | ICD-10-CM | POA: Diagnosis present

## 2022-07-20 DIAGNOSIS — Z1152 Encounter for screening for COVID-19: Secondary | ICD-10-CM

## 2022-07-20 DIAGNOSIS — E785 Hyperlipidemia, unspecified: Secondary | ICD-10-CM | POA: Diagnosis present

## 2022-07-20 DIAGNOSIS — R7401 Elevation of levels of liver transaminase levels: Secondary | ICD-10-CM | POA: Diagnosis present

## 2022-07-20 DIAGNOSIS — I340 Nonrheumatic mitral (valve) insufficiency: Secondary | ICD-10-CM

## 2022-07-20 DIAGNOSIS — R61 Generalized hyperhidrosis: Secondary | ICD-10-CM

## 2022-07-20 DIAGNOSIS — R739 Hyperglycemia, unspecified: Secondary | ICD-10-CM

## 2022-07-20 DIAGNOSIS — R0602 Shortness of breath: Secondary | ICD-10-CM

## 2022-07-20 DIAGNOSIS — I2510 Atherosclerotic heart disease of native coronary artery without angina pectoris: Secondary | ICD-10-CM | POA: Diagnosis present

## 2022-07-20 DIAGNOSIS — I272 Pulmonary hypertension, unspecified: Secondary | ICD-10-CM | POA: Diagnosis present

## 2022-07-20 DIAGNOSIS — E669 Obesity, unspecified: Secondary | ICD-10-CM | POA: Diagnosis present

## 2022-07-20 DIAGNOSIS — Z6834 Body mass index (BMI) 34.0-34.9, adult: Secondary | ICD-10-CM

## 2022-07-20 DIAGNOSIS — Z79899 Other long term (current) drug therapy: Secondary | ICD-10-CM

## 2022-07-20 DIAGNOSIS — G4733 Obstructive sleep apnea (adult) (pediatric): Secondary | ICD-10-CM | POA: Diagnosis present

## 2022-07-20 DIAGNOSIS — I429 Cardiomyopathy, unspecified: Secondary | ICD-10-CM | POA: Diagnosis not present

## 2022-07-20 DIAGNOSIS — I110 Hypertensive heart disease with heart failure: Principal | ICD-10-CM | POA: Diagnosis present

## 2022-07-20 DIAGNOSIS — Z8249 Family history of ischemic heart disease and other diseases of the circulatory system: Secondary | ICD-10-CM

## 2022-07-20 DIAGNOSIS — N179 Acute kidney failure, unspecified: Secondary | ICD-10-CM | POA: Diagnosis present

## 2022-07-20 DIAGNOSIS — R7303 Prediabetes: Secondary | ICD-10-CM | POA: Diagnosis present

## 2022-07-20 LAB — COMPREHENSIVE METABOLIC PANEL
ALT: 82 U/L — ABNORMAL HIGH (ref 0–44)
AST: 89 U/L — ABNORMAL HIGH (ref 15–41)
Albumin: 3.2 g/dL — ABNORMAL LOW (ref 3.5–5.0)
Alkaline Phosphatase: 58 U/L (ref 38–126)
Anion gap: 9 (ref 5–15)
BUN: 21 mg/dL (ref 8–23)
CO2: 19 mmol/L — ABNORMAL LOW (ref 22–32)
Calcium: 8.5 mg/dL — ABNORMAL LOW (ref 8.9–10.3)
Chloride: 108 mmol/L (ref 98–111)
Creatinine, Ser: 1.85 mg/dL — ABNORMAL HIGH (ref 0.61–1.24)
GFR, Estimated: 40 mL/min — ABNORMAL LOW (ref >60)
Glucose, Bld: 254 mg/dL — ABNORMAL HIGH (ref 70–99)
Potassium: 3.9 mmol/L (ref 3.5–5.1)
Sodium: 136 mmol/L (ref 135–145)
Total Bilirubin: 0.6 mg/dL (ref 0.3–1.2)
Total Protein: 6.3 g/dL — ABNORMAL LOW (ref 6.5–8.1)

## 2022-07-20 LAB — RESP PANEL BY RT-PCR (RSV, FLU A&B, COVID)  RVPGX2
Influenza A by PCR: NEGATIVE
Influenza B by PCR: NEGATIVE
Resp Syncytial Virus by PCR: NEGATIVE
SARS Coronavirus 2 by RT PCR: NEGATIVE

## 2022-07-20 LAB — ECHOCARDIOGRAM COMPLETE
Area-P 1/2: 1.85 cm2
Calc EF: 37.3 %
Height: 76 in
MV M vel: 5.22 m/s
MV Peak grad: 109 mmHg
Radius: 1 cm
S' Lateral: 4.6 cm
Single Plane A2C EF: 40.6 %
Single Plane A4C EF: 35.4 %
Weight: 3520 oz

## 2022-07-20 LAB — CBC WITH DIFFERENTIAL/PLATELET
Abs Immature Granulocytes: 0.08 10*3/uL — ABNORMAL HIGH (ref 0.00–0.07)
Basophils Absolute: 0 10*3/uL (ref 0.0–0.1)
Basophils Relative: 0 %
Eosinophils Absolute: 0.1 10*3/uL (ref 0.0–0.5)
Eosinophils Relative: 1 %
HCT: 44.5 % (ref 39.0–52.0)
Hemoglobin: 14.9 g/dL (ref 13.0–17.0)
Immature Granulocytes: 1 %
Lymphocytes Relative: 20 %
Lymphs Abs: 2.1 10*3/uL (ref 0.7–4.0)
MCH: 29.7 pg (ref 26.0–34.0)
MCHC: 33.5 g/dL (ref 30.0–36.0)
MCV: 88.6 fL (ref 80.0–100.0)
Monocytes Absolute: 0.4 10*3/uL (ref 0.1–1.0)
Monocytes Relative: 4 %
Neutro Abs: 8 10*3/uL — ABNORMAL HIGH (ref 1.7–7.7)
Neutrophils Relative %: 74 %
Platelets: 256 10*3/uL (ref 150–400)
RBC: 5.02 MIL/uL (ref 4.22–5.81)
RDW: 12.4 % (ref 11.5–15.5)
WBC: 10.6 10*3/uL — ABNORMAL HIGH (ref 4.0–10.5)
nRBC: 0 % (ref 0.0–0.2)

## 2022-07-20 LAB — HIV ANTIBODY (ROUTINE TESTING W REFLEX): HIV Screen 4th Generation wRfx: NONREACTIVE

## 2022-07-20 LAB — TROPONIN I (HIGH SENSITIVITY)
Troponin I (High Sensitivity): 10 ng/L (ref <18)
Troponin I (High Sensitivity): 36 ng/L — ABNORMAL HIGH (ref <18)

## 2022-07-20 LAB — BRAIN NATRIURETIC PEPTIDE: B Natriuretic Peptide: 114.4 pg/mL — ABNORMAL HIGH (ref 0.0–100.0)

## 2022-07-20 LAB — TSH: TSH: 2.38 u[IU]/mL (ref 0.350–4.500)

## 2022-07-20 LAB — MAGNESIUM: Magnesium: 1.8 mg/dL (ref 1.7–2.4)

## 2022-07-20 MED ORDER — ADENOSINE 6 MG/2ML IV SOLN
INTRAVENOUS | Status: AC
  Filled 2022-07-20: qty 4

## 2022-07-20 MED ORDER — SODIUM CHLORIDE 0.9 % IV BOLUS
1000.0000 mL | Freq: Once | INTRAVENOUS | Status: AC
  Administered 2022-07-20: 1000 mL via INTRAVENOUS

## 2022-07-20 MED ORDER — ASPIRIN 81 MG PO TBEC
81.0000 mg | DELAYED_RELEASE_TABLET | Freq: Every day | ORAL | Status: DC
  Administered 2022-07-21 – 2022-07-23 (×3): 81 mg via ORAL
  Filled 2022-07-20 (×3): qty 1

## 2022-07-20 MED ORDER — ASPIRIN 81 MG PO CHEW
324.0000 mg | CHEWABLE_TABLET | ORAL | Status: AC
  Administered 2022-07-20: 324 mg via ORAL
  Filled 2022-07-20: qty 4

## 2022-07-20 MED ORDER — ONDANSETRON HCL 4 MG/2ML IJ SOLN: 4.0000 mg | Freq: Four times a day (QID) | INTRAMUSCULAR | Status: DC | PRN

## 2022-07-20 MED ORDER — METOPROLOL TARTRATE 25 MG PO TABS
25.0000 mg | ORAL_TABLET | Freq: Two times a day (BID) | ORAL | Status: DC
  Administered 2022-07-20 (×2): 25 mg via ORAL
  Filled 2022-07-20 (×3): qty 1

## 2022-07-20 MED ORDER — ASPIRIN 300 MG RE SUPP: 300.0000 mg | RECTAL | Status: AC

## 2022-07-20 MED ORDER — ACETAMINOPHEN 325 MG PO TABS: 650.0000 mg | ORAL_TABLET | ORAL | Status: DC | PRN

## 2022-07-20 MED ORDER — ETOMIDATE 2 MG/ML IV SOLN
INTRAVENOUS | Status: AC
  Filled 2022-07-20: qty 10

## 2022-07-20 MED ORDER — ADENOSINE 6 MG/2ML IV SOLN
INTRAVENOUS | Status: AC
  Filled 2022-07-20: qty 2

## 2022-07-20 MED ORDER — ADENOSINE 6 MG/2ML IV SOLN
INTRAVENOUS | Status: AC
  Administered 2022-07-20: 12 mg via INTRAVENOUS
  Filled 2022-07-20: qty 2

## 2022-07-20 MED ORDER — NITROGLYCERIN 0.4 MG SL SUBL: 0.4000 mg | SUBLINGUAL_TABLET | SUBLINGUAL | Status: DC | PRN

## 2022-07-20 MED ORDER — HEPARIN SODIUM (PORCINE) 5000 UNIT/ML IJ SOLN
5000.0000 [IU] | Freq: Three times a day (TID) | INTRAMUSCULAR | Status: DC
  Administered 2022-07-20 – 2022-07-22 (×6): 5000 [IU] via SUBCUTANEOUS
  Filled 2022-07-20 (×6): qty 1

## 2022-07-20 MED ORDER — ETOMIDATE 2 MG/ML IV SOLN
10.0000 mg | Freq: Once | INTRAVENOUS | Status: AC
  Administered 2022-07-20: 10 mg via INTRAVENOUS

## 2022-07-20 NOTE — ED Provider Notes (Signed)
[facility] EMERGENC[facility] DEPARTMENT Provider Note   CSN: [id number] Arrival date & time: 07/20/22  0820     History  No chief complaint on file.   Angel Lyons is a 62 y.o. male.  The history is provided by the patient, medical records and the EMS personnel. No language interpreter was used.  Chest Pain Pain location:  Substernal area Pain quality: pressure   Pain radiates to:  Does not radiate Pain severity:  Moderate Onset quality:  Gradual Duration:  1 day Timing:  Constant Progression:  Waxing and waning Chronicity:  Recurrent Relieved by:  Nothing Worsened by:  Exertion Ineffective treatments:  None tried Associated symptoms: diaphoresis and shortness of breath   Associated symptoms: no abdominal pain, no altered mental status, no anxiety, no back pain, no cough, no fatigue, no fever, no headache, no lower extremity edema, no nausea, no near-syncope, no palpitations, no vomiting and no weakness        Home Medications Prior to Admission medications   Medication Sig Start Date End Date Taking? Authorizing Provider  ibuprofen (ADVIL,MOTRIN) 800 MG tablet Take 1 tablet (800 mg total) by mouth 3 (three) times daily. 03/12/16   Larene Pickett, PA-C  methocarbamol (ROBAXIN) 500 MG tablet Take 1 tablet (500 mg total) by mouth at bedtime as needed for muscle spasms. 01/20/17   Avie Echevaria B, PA-C  naproxen (NAPROSYN) 500 MG tablet Take 1 tablet (500 mg total) by mouth 2 (two) times daily with a meal. 01/20/17   Emeline General, PA-C      Allergies    Patient has no known allergies.    Review of Systems   Review of Systems  Constitutional:  Positive for diaphoresis. Negative for chills, fatigue and fever.  HENT:  Negative for congestion.   Eyes:  Negative for visual disturbance.  Respiratory:  Positive for chest tightness and shortness of breath. Negative for cough and wheezing.   Cardiovascular:  Positive for chest pain. Negative for  palpitations, leg swelling and near-syncope.  Gastrointestinal:  Negative for abdominal pain, constipation, diarrhea, nausea and vomiting.  Genitourinary:  Negative for dysuria and frequency.  Musculoskeletal:  Negative for back pain.  Skin:  Negative for rash.  Neurological:  Positive for light-headedness. Negative for weakness and headaches.  Psychiatric/Behavioral:  Negative for agitation.   All other systems reviewed and are negative.   Physical Exam Updated Vital Signs BP (!) 133/100   Pulse 68   Temp 98.2 F (36.8 C) (Oral)   Resp (!) 21   Ht 6\' 4"  (1.93 m)   Wt 99.8 kg   SpO2 98%   BMI 26.78 kg/m  Physical Exam Vitals and nursing note reviewed.  Constitutional:      General: He is not in acute distress.    Appearance: He is well-developed. He is ill-appearing and diaphoretic. He is not toxic-appearing.  HENT:     Head: Normocephalic and atraumatic.     Mouth/Throat:     Mouth: Mucous membranes are moist.  Eyes:     Conjunctiva/sclera: Conjunctivae normal.     Pupils: Pupils are equal, round, and reactive to light.  Cardiovascular:     Rate and Rhythm: Regular rhythm. Tachycardia present.     Heart sounds: No murmur heard. Pulmonary:     Effort: Pulmonary effort is normal. No respiratory distress.     Breath sounds: Normal breath sounds. No wheezing, rhonchi or rales.  Chest:     Chest wall: No tenderness.  Abdominal:     Palpations: Abdomen is soft.     Tenderness: There is no abdominal tenderness.  Musculoskeletal:        General: No swelling.     Cervical back: Neck supple.     Right lower leg: No tenderness. No edema.     Left lower leg: No tenderness. No edema.  Skin:    General: Skin is warm.     Capillary Refill: Capillary refill takes less than 2 seconds.     Coloration: Skin is not pale.     Findings: No erythema.  Neurological:     General: No focal deficit present.     Mental Status: He is alert.  Psychiatric:        Mood and Affect: Mood is  anxious.     ED Results / Procedures / Treatments   Labs (all labs ordered are listed, but only abnormal results are displayed) Labs Reviewed  CBC WITH DIFFERENTIAL/PLATELET - Abnormal; Notable for the following components:      Result Value   WBC 10.6 (*)    Neutro Abs 8.0 (*)    Abs Immature Granulocytes 0.08 (*)    All other components within normal limits  COMPREHENSIVE METABOLIC PANEL - Abnormal; Notable for the following components:   CO2 19 (*)    Glucose, Bld 254 (*)    Creatinine, Ser 1.85 (*)    Calcium 8.5 (*)    Total Protein 6.3 (*)    Albumin 3.2 (*)    AST 89 (*)    ALT 82 (*)    GFR, Estimated 40 (*)    All other components within normal limits  BRAIN NATRIURETIC PEPTIDE - Abnormal; Notable for the following components:   B Natriuretic Peptide 114.4 (*)    All other components within normal limits  TROPONIN I (HIGH SENSITIVITY) - Abnormal; Notable for the following components:   Troponin I (High Sensitivity) 36 (*)    All other components within normal limits  RESP PANEL BY RT-PCR (RSV, FLU A&B, COVID)  RVPGX2  MAGNESIUM  TSH  TROPONIN I (HIGH SENSITIVITY)    EKG EKG Interpretation  Date/Time:  Wednesday July 20 2022 08:43:59 EST Ventricular Rate:  129 PR Interval:  214 QRS Duration: 100 QT Interval:  315 QTC Calculation: 419 R Axis:   0 Text Interpretation: Sinus tachycardia Ventricular bigeminy Borderline prolonged PR interval RSR' in V1 or V2, right VCD or RVH ST elevation, consider inferior injury when comapred to prior, now converted to sinus rhythm with PVCs./ No STEMI Confirmed by Theda Belfast ([id number]) on 07/20/2022 8:50:08 AM   EKG Interpretation  Date/Time:  Wednesday July 20 2022 08:34:23 EST Ventricular Rate:  245 PR Interval:  36 QRS Duration: 153 QT Interval:  207 QTC Calculation: 418 R Axis:   -48 Text Interpretation: Extreme tachycardia with wide complex, no further rhythm analysis attempted when compared to prior, now  SVT No STEMI Confirmed by Theda Belfast ([id number]) on 07/20/2022 8:49:19 AM  Radiology DG Chest Portable 1 View  Result Date: 07/20/2022 CLINICAL DATA:  SVT.  Shortness of breath and cough EXAM: PORTABLE CHEST 1 VIEW COMPARISON:  7/31/7 FINDINGS: Cardiac enlargement. There is no pleural effusion or edema. No airspace opacities identified. Review of the visualized osseous structures is unremarkable. IMPRESSION: No acute cardiopulmonary abnormalities. Cardiac enlargement Electronically Signed   By: Signa Kell M.D.   On: 07/20/2022 09:13    Procedures .Cardioversion  Date/Time: 07/20/2022 8:58 AM  Performed by: Heide Scales, MD  Authorized by: Courtney Paris, MD   Consent:    Consent obtained:  Emergent situation, written and verbal   Consent given by:  Patient   Risks discussed:  Induced arrhythmia, pain and death   Alternatives discussed:  No treatment Pre-procedure details:    Cardioversion basis:  Emergent   Rhythm:  Supraventricular tachycardia   Electrode placement:  Anterior-posterior Patient sedated: Yes. Refer to sedation procedure documentation for details of sedation.  Attempt one:    Cardioversion mode:  Synchronous   Waveform:  Biphasic   Shock (Joules):  200   Shock outcome:  Conversion to normal sinus rhythm Post-procedure details:    Patient status:  Awake   Patient tolerance of procedure:  Tolerated well, no immediate complications Comments:     2 attempts were made with adenosine at 12 mg and 12 mg respectively as EMS already tried 6 mg without success.  They were unsuccessful but the first attempt with sedation and cardioversion electrically was successful.  .Sedation  Date/Time: 07/20/2022 8:59 AM  Performed by: Courtney Paris, MD Authorized by: Courtney Paris, MD   Consent:    Consent obtained:  Emergent situation, written and verbal   Consent given by:  Patient   Risks discussed:  Allergic reaction Universal protocol:     Immediately prior to procedure, a time out was called: yes   Indications:    Procedure performed:  Cardioversion Pre-sedation assessment:    Time since last food or drink:  Unknown   ASA classification: class 1 - normal, healthy patient     Mallampati score:  I - soft palate, uvula, fauces, pillars visible   Pre-sedation assessments completed and reviewed: pre-procedure respiratory function not reviewed   Immediate pre-procedure details:    Reassessment: Patient reassessed immediately prior to procedure     Reviewed: vital signs     Verified: emergency equipment available, IV patency confirmed and oxygen available   Procedure details (see MAR for exact dosages):    Preoxygenation:  Nasal cannula   Sedation:  Etomidate   Intended level of sedation: deep   Intra-procedure events: none     Total Provider sedation time (minutes):  30 Post-procedure details:    Patient is stable for discharge or admission: yes     Procedure completion:  Tolerated well, no immediate complications     CRITICAL CARE Performed by: Gwenyth Allegra Evanthia Maund Total critical care time: 35 minutes Critical care time was exclusive of separately billable procedures and treating other patients. Critical care was necessary to treat or prevent imminent or life-threatening deterioration. Critical care was time spent personally by me on the following activities: development of treatment plan with patient and/or surrogate as well as nursing, discussions with consultants, evaluation of patient's response to treatment, examination of patient, obtaining history from patient or surrogate, ordering and performing treatments and interventions, ordering and review of laboratory studies, ordering and review of radiographic studies, pulse oximetry and re-evaluation of patient's condition.   Medications Ordered in ED Medications  adenosine (ADENOCARD) 6 MG/2ML injection (has no administration in time range)  adenosine (ADENOCARD) 6  MG/2ML injection (has no administration in time range)  etomidate (AMIDATE) 2 MG/ML injection (has no administration in time range)  sodium chloride 0.9 % bolus 1,000 mL (has no administration in time range)  adenosine (ADENOCARD) 6 MG/2ML injection (12 mg Intravenous Given 07/20/22 0836)  etomidate (AMIDATE) injection 10 mg (10 mg Intravenous Given 07/20/22 2992)    ED Course/ Medical Decision Making/ A&P Clinical  Course as of 07/20/22 1357  Wed Jul 20, 2022  1610 EKG 12-Lead [AM]  1246 Troponin I (High Sensitivity)(!) [AM]    Clinical Course User Index [AM] Alvira Philips, Student-PA                             Medical Decision Making Amount and/or Complexity of Data Reviewed Labs: ordered. Radiology: ordered.  Risk Prescription drug management. Decision regarding hospitalization.    Damel YURI FANA is a 62 y.o. male with a past medical history significant for hypertension and sleep apnea although patient denies medical history presents with tachycardia chest pain shortness of breath.  According to patient, for the last few weeks he has had some exertional chest pain and exertional shortness of breath and was scheduled see cardiologist sometime in the future.  He reports that today while going up stairs at about 6:30 AM he had onset of more pressure and fast heart rate and shortness of breath.  He reports getting lightheaded and sweaty but otherwise no nausea or vomiting reported.  No constipation, diarrhea, or urinary changes.  No recent fevers, chills, congestion, or cough.  Per EMS, patient was found to have a rhythm that was going extremely fast with a rate of about 250.  I spoke with EMS over the radio and route and cardiology was at hand and they felt it looked like SVT and recommended adenosine.  Inosine 6 mg was attempted and route and there was no change in rhythm.  Patient then arrived and continues to be with a rate in the 240s.  A vagal maneuver was attempted without  success.  We then attempted to tries at 12 mg of adenosine without success.  Patient was then consented for ED cardioversion and procedural sedation with etomidate.  This took place without difficulty and patient was converted back to a sinus rhythm with PVCs.  Patient is now feeling much better without chest pain or shortness of breath.  We will check labs and look for any underlying troubles.  Due to his exertional symptoms chest pain short of breath and the new rhythm, anticipate discussion with cardiology after workup is completed to discuss disposition.  On my exam, lungs were clear and chest was nontender.  Abdomen nontender.  No significant swelling of the legs.  Good pulses in extremities.  Patient mentating well both before and after the sedation now.  Anticipate reassessment after labs and workup.   1:57 PM Labs returned with rising troponin as expected and viral testing was negative.  BNP slightly elevated at 114.  TSH normal.  Mild leukocytosis.  Cardiology saw patient and feels he does need admission for echo and possible intervention if he has recurrent SVT.  Patient be admitted for further management.        Final Clinical Impression(s) / ED Diagnoses Final diagnoses:  SVT (supraventricular tachycardia)  Shortness of breath  Diaphoresis  Chest pain, unspecified type    Clinical Impression: 1. SVT (supraventricular tachycardia)   2. Shortness of breath   3. Diaphoresis   4. Chest pain, unspecified type     Disposition: Admit  This note was prepared with assistance of Dragon voice recognition software. Occasional wrong-word or sound-a-like substitutions may have occurred due to the inherent limitations of voice recognition software.     [place]lecity Lemaster, Canary Brim, MD 07/20/22 [phone]

## 2022-07-20 NOTE — H&P (Addendum)
Cardiology H&P   Patient ID: Angel Lyons MRN: [id number]; DOB: [birth year]  Admit date: 07/20/2022 Date of Consult: 07/20/2022  PCP:  Laurena Slimmer, MD (Inactive)    [facility] Providers Cardiologist: New, Dr. Eldridge Dace  Patient Profile:   Angel Lyons is a 62 y.o. male with a hx of HTN and OSA who is being seen 07/20/2022 for the evaluation of chest pain/SVT at the request of Dr. Rush Landmark.  History of Present Illness:   Angel Lyons is a 62 yo male with PMH of HTN and OSA.  He denies ever been seen by cardiologist.  Reports he follows with a PCP on a regular basis.  States he has a history of hypertension but has not been on medications as his blood pressures have improved.  Denies any other known history.  States his last PCP visit was 02/2022.  Denies any family history of CAD.   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  States he goes to the gym 4 to 5 days a week.  Over the past several months he has been noticing exertional chest tightness and shortness of breath while at the gym.  States episodes have become increasingly noticeable and recently mentioned them to his PCP.  She recommended referral to cardiology though he has not had a visit yet.  This morning he got up and went to work.  While walking up the stairs he became increasingly short of breath and just felt "unwell".  Coworker noted that he did not seem himself.  EMS was eventually called and he was noted to be in SVT with a rate of 240 bpm.  While in route he was given a adenosine 6 mg with no change in rhythm.  On arrival to the ED, vagal maneuvers were attempted without success.  He was also given an additional 12 mg of adenosine without success.  He underwent successful cardioversion with return to sinus tachycardia with frequent PVCs.  Labs in the ED showed sodium 136, potassium 3.9, creatinine 1.8, albumin 3.2, AST 89, ALT 82, BNP 114, high-sensitivity troponin 10>> 36, WBC 10.6,  hemoglobin 14.9.  Respiratory panel negative.  Chest x-ray negative.    Past Medical History:  Diagnosis Date   Abnormal heart rhythm    HTN (hypertension)    Sleep apnea     Past Surgical History:  Procedure Laterality Date   NO PAST SURGERIES       Home Medications:  Prior to Admission medications   Medication Sig Start Date End Date Taking? Authorizing Provider  ibuprofen (ADVIL,MOTRIN) 800 MG tablet Take 1 tablet (800 mg total) by mouth 3 (three) times daily. 03/12/16   Garlon Hatchet, PA-C  methocarbamol (ROBAXIN) 500 MG tablet Take 1 tablet (500 mg total) by mouth at bedtime as needed for muscle spasms. 01/20/17   Mathews Robinsons B, PA-C  naproxen (NAPROSYN) 500 MG tablet Take 1 tablet (500 mg total) by mouth 2 (two) times daily with a meal. 01/20/17   Georgiana Shore, PA-C    Inpatient Medications: Scheduled Meds:  adenosine       adenosine       Continuous Infusions:  PRN Meds: adenosine, adenosine  Allergies:   No Known Allergies  Social History:   Social History   Socioeconomic History   Marital status: Married    Spouse name: Not on file   Number of children: 4   Years of education: Not on file   Highest education level:  Not on file  Occupational History   Occupation: Proofreader  Tobacco Use   Smoking status: Never   Smokeless tobacco: Never  Substance and Sexual Activity   Alcohol use: No   Drug use: No   Sexual activity: Not on file  Other Topics Concern   Not on file  Social History Narrative   Not on file   Social Determinants of Health   Financial Resource Strain: Not on file  Food Insecurity: Not on file  Transportation Needs: Not on file  Physical Activity: Not on file  Stress: Not on file  Social Connections: Not on file  Intimate Partner Violence: Not on file    Family History:    Family History  Problem Relation Age of Onset   Hypertension Father      ROS:  Please see the history of present illness.   All  other ROS reviewed and negative.     Physical Exam/Data:   Vitals:   07/20/22 1100 07/20/22 1200 07/20/22 1230 07/20/22 1243  BP: (!) 160/94 (!) 132/90 (!) 121/91 (!) 121/91  Pulse: (!) 122 65 91 91  Resp: (!) 24 (!) 21 17 20   Temp:      TempSrc:      SpO2: 95% 97% 95% 100%  Weight:      Height:        Intake/Output Summary (Last 24 hours) at 07/20/2022 1318 Last data filed at 07/20/2022 1030 Gross per 24 hour  Intake 1000 ml  Output --  Net 1000 ml      07/20/2022    9:47 AM 01/20/2017    9:00 PM 03/11/2016   11:13 PM  Last 3 Weights  Weight (lbs) 220 lb 220 lb 230 lb  Weight (kg) 99.791 kg 99.791 kg 104.327 kg     Body mass index is 26.78 kg/m.  General:  Well nourished, well developed, in no acute distress HEENT: normal Neck: no JVD Vascular: No carotid bruits; Distal pulses 2+ bilaterally Cardiac:  normal S1, S2; RRR; no murmur  Lungs:  clear to auscultation bilaterally, no wheezing, rhonchi or rales  Abd: soft, nontender, no hepatomegaly  Ext: no edema Musculoskeletal:  No deformities, BUE and BLE strength normal and equal Skin: warm and dry  Neuro:  CNs 2-12 intact, no focal abnormalities noted Psych:  Normal affect   EKG:  The EKG was personally reviewed and demonstrates:    8:34 AM SVT  245 bpm 8:42 AM sinus tachycardia 137 bpm, bigeminy 8:43 AM sinus tachycardia 129 bpm, multifocal PVCs   Relevant CV Studies:  N/a   Laboratory Data:  High Sensitivity Troponin:   Recent Labs  Lab 07/20/22 0847 07/20/22 1124  TROPONINIHS 10 36*     Chemistry Recent Labs  Lab 07/20/22 0847  NA 136  K 3.9  CL 108  CO2 19*  GLUCOSE 254*  BUN 21  CREATININE 1.85*  CALCIUM 8.5*  MG 1.8  GFRNONAA 40*  ANIONGAP 9    Recent Labs  Lab 07/20/22 0847  PROT 6.3*  ALBUMIN 3.2*  AST 89*  ALT 82*  ALKPHOS 58  BILITOT 0.6   Lipids No results for input(s): "CHOL", "TRIG", "HDL", "LABVLDL", "LDLCALC", "CHOLHDL" in the last 168 hours.  Hematology Recent  Labs  Lab 07/20/22 0847  WBC 10.6*  RBC 5.02  HGB 14.9  HCT 44.5  MCV 88.6  MCH 29.7  MCHC 33.5  RDW 12.4  PLT 256   Thyroid  Recent Labs  Lab 07/20/22 0847  TSH 2.380    BNP Recent Labs  Lab 07/20/22 0850  BNP 114.4*    DDimer No results for input(s): "DDIMER" in the last 168 hours.   Radiology/Studies:  DG Chest Portable 1 View  Result Date: 07/20/2022 CLINICAL DATA:  SVT.  Shortness of breath and cough EXAM: PORTABLE CHEST 1 VIEW COMPARISON:  7/31/7 FINDINGS: Cardiac enlargement. There is no pleural effusion or edema. No airspace opacities identified. Review of the visualized osseous structures is unremarkable. IMPRESSION: No acute cardiopulmonary abnormalities. Cardiac enlargement Electronically Signed   By: Kerby Moors M.D.   On: 07/20/2022 09:13     Assessment and Plan:   Angel Lyons is a 62 y.o. male with a hx of HTN and OSA who is being seen 07/20/2022 for the evaluation of chest pain/SVT at the request of Dr. Sherry Ruffing.  Chest pain -- Reports over the past several months he has been experiencing exertional chest discomfort and shortness of breath while at the gym which is relieved with rest.  Was referred to cardiology as an outpatient by PCP but has not had a visit yet. hsTn 10>>36 though in the setting of SVT -- question whether episodes are related to arrhythmia? -- check echo, further recommendations based on findings  SVT -- Developed episode this morning while climbing steps at work in the cold.  Did not respond to vagal maneuvers or adenosine.  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 Will ask EP to weigh in regarding potential options  HTN -- Reports he was previously on antihypertensives but blood pressure improved and he has not been on any recently --  as above add metoprolol 25mg  BID   Elevated glucose -- Denies any history of diabetes -- Check hemoglobin A1c  Elevated LFTs -- Denies any history of EtOH use -- recheck in am  CKD vs AKI -- Cr 1.85, unclear baseline -- given 1L NS in the ED -- BMET in am   For questions or updates, please contact [place] Please consult [email or web address] for contact info under    Signed, Reino Bellis, NP  07/20/2022 1:18 PM  I have examined the patient and reviewed assessment and plan and discussed with patient.  Agree with above as stated.    ECG strips reviewed.  I suspect his heart rate of 247 bpm was likely 1:1 conduction of atrial flutter.  Will get EP consult.  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  I briefly discussed the procedure with the patient and he was agreeable.  In addition, he has some other medical issues including renal insufficiency and elevated blood sugar.  Will check hemoglobin A1c.  Will see if his creatinine comes down after some IV fluids.  The episodes he was having previously I suspect were shorter episodes of this arrhythmia that spontaneously resolved.  Given that he did not have syncope even with prolonged heart rate in the 240s, I suspect his LV function will be normal.  I think his electrical issues supersede any need for ischemic evaluation at this point.  If his EF is significantly decreased, may need to expedite an ischemic evaluation but for now, we will focus on the electrical issue.   Larae Grooms

## 2022-07-20 NOTE — ED Notes (Signed)
Patient son at bedside. Family given chairs. Denies any other needs.

## 2022-07-20 NOTE — Sedation Documentation (Signed)
Dr. Gustavus Messing shock at 200 joules.

## 2022-07-20 NOTE — Progress Notes (Signed)
Jaw thrust provided to pt during cardioversion via RRT.

## 2022-07-20 NOTE — Consult Note (Addendum)
   ELECTROPHYSIOLOGY CONSULT NOTE    Patient ID: Angel Lyons MRN: [id number], DOB/AGE: Aug 17, 1960 62 y.o.  Admit date: 07/20/2022 Date of Consult: 07/20/2022  Primary Physician: Foye Spurling, MD (Inactive) Primary Cardiologist: None  Electrophysiologist: New   Referring Provider: Dr. Irish Lack  Patient Profile: Angel Lyons is a 62 y.o. male with a history of HTN and OSA (distant sleep study, intolerant of CPAP) who is being seen today for the evaluation of SVT at the request of Dr. Irish Lack.  HPI:  [name] is a 62 y.o. male with medical history as above.  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 He reports regular PCP follow up. Denies any family history of CAD. Father had HTN. He goes to the gym 4-5 times a week.   He has noticed some increased SOB after exercise.   This morning, he got up as usual for work and noted increased SOB and malaise. He felt increasingly unwell and EMS was called. On their arrival was noted to be in SVT at 240 bpm.  Given adenosine 6 mg with no change in rhythm. In ED, was vagal maneuvers and an additinoal 12 mg of adenosine were also un-successful. He underwent urgent cardioversion to Sinus tachy with frequent PVCs.   Pt currently feeling OK at rest.   CXR unremarkable.  Viral panel negative  Labs Potassium3.9 (01/17 0847) Magnesium  1.8 (01/17 0847) Creatinine, ser  1.85* (01/17 0847) PLT  256 (01/17 0847) HGB  14.9 (01/17 0847) WBC 10.6* (01/17 0847) Troponin I (High Sensitivity)36* (01/17 1124).    Inpatient Medications:   adenosine       adenosine       [START ON 07/21/2022] aspirin EC  81 mg Oral Daily   heparin  5,000 Units Subcutaneous Q8H   metoprolol tartrate  25 mg Oral BID   Physical Exam: Vitals:   07/20/22 1300 07/20/22 1330 07/20/22 1400 07/20/22 1427  BP: (!) 149/106 (!) 133/100 (!) 140/103   Pulse: 95 68 95   Resp: 19 (!) 21 (!) 21   Temp:    98.3 F (36.8 C)  TempSrc:    Oral  SpO2: 97% 98% 100%    Weight:      Height:       GEN- Very pleasant. NAD. A&Ox3.  HEENT: Normocephalic, atraumatic Lungs- CTAB, normal effort.  Heart- Regularly irregular due to PVCs. No M/G/R.  GI- Soft, NT, ND  Extremities- no clubbing, cyanosis, or edema   EKG:on arrival showed SVT vs 1:1 flutter at approx 240 bpm. Subsequent EKGs show sinus tach in 120-130s with PVCs (personally reviewed)  TELEMETRY: NSR 70-90s currently with occasional PVCs (personally reviewed)  Assessment/Plan: 1.  SVT / Possible 1:1 flutter No prior history of sustained tachy-palpitations.  Electrolytes OK.  Agree with starting BB We discussed the possibility of outpatient ablation in the future pending further work up.  Would avoid AAD for the time being so that he can be induced at time of ablation.   2. HTN Stable on current regimen   3. AKI Cr 1.85, unclear baseline.   4. Transaminitis AST 89, ALT 82. Follow.   5. Hyperglycemia Glucose 254. Hgb A1c pending.    For questions or updates, please contact [place] Please consult [email or web address] for contact info under Cardiology/STEMI.  Jacalyn Lefevre, PA-C  07/20/2022 2:33 PM

## 2022-07-20 NOTE — ED Triage Notes (Signed)
"  At work this morning, shortness of breath began around 0600. On our arrival he was in SVT rate 246. Adenosine 6mg  IVP given. Denies chest pain, denies n/v" per pt per EMS

## 2022-07-20 NOTE — Progress Notes (Signed)
Echocardiogram 2D Echocardiogram has been performed.  Oneal Deputy Lequan Dobratz RDCS 07/20/2022, 3:32 PM

## 2022-07-21 ENCOUNTER — Observation Stay (HOSPITAL_COMMUNITY): Payer: Commercial Managed Care - PPO | Admitting: Anesthesiology

## 2022-07-21 ENCOUNTER — Encounter (HOSPITAL_COMMUNITY)
Admission: EM | Disposition: A | Discharge status: Home / Self Care | Payer: Self-pay | Source: Home / Self Care | Attending: Interventional Cardiology

## 2022-07-21 ENCOUNTER — Observation Stay (HOSPITAL_BASED_OUTPATIENT_CLINIC_OR_DEPARTMENT_OTHER): Payer: Commercial Managed Care - PPO | Admitting: Anesthesiology

## 2022-07-21 ENCOUNTER — Observation Stay (HOSPITAL_COMMUNITY): Payer: Commercial Managed Care - PPO

## 2022-07-21 ENCOUNTER — Encounter (HOSPITAL_COMMUNITY): Payer: Self-pay | Admitting: Interventional Cardiology

## 2022-07-21 DIAGNOSIS — I340 Nonrheumatic mitral (valve) insufficiency: Secondary | ICD-10-CM

## 2022-07-21 DIAGNOSIS — I10 Essential (primary) hypertension: Secondary | ICD-10-CM

## 2022-07-21 DIAGNOSIS — I471 Supraventricular tachycardia, unspecified: Secondary | ICD-10-CM | POA: Diagnosis not present

## 2022-07-21 DIAGNOSIS — E669 Obesity, unspecified: Secondary | ICD-10-CM | POA: Diagnosis not present

## 2022-07-21 DIAGNOSIS — G4730 Sleep apnea, unspecified: Secondary | ICD-10-CM

## 2022-07-21 DIAGNOSIS — Z6832 Body mass index (BMI) 32.0-32.9, adult: Secondary | ICD-10-CM

## 2022-07-21 DIAGNOSIS — N183 Chronic kidney disease, stage 3 unspecified: Secondary | ICD-10-CM

## 2022-07-21 HISTORY — PX: TEE WITHOUT CARDIOVERSION: SHX5443

## 2022-07-21 LAB — LIPID PANEL
Cholesterol: 170 mg/dL (ref 0–200)
HDL: 36 mg/dL — ABNORMAL LOW (ref >40)
LDL Cholesterol: 122 mg/dL — ABNORMAL HIGH (ref 0–99)
Total CHOL/HDL Ratio: 4.7 RATIO
Triglycerides: 62 mg/dL (ref <150)
VLDL: 12 mg/dL (ref 0–40)

## 2022-07-21 LAB — CBC
HCT: 43.3 % (ref 39.0–52.0)
Hemoglobin: 14.4 g/dL (ref 13.0–17.0)
MCH: 29.7 pg (ref 26.0–34.0)
MCHC: 33.3 g/dL (ref 30.0–36.0)
MCV: 89.3 fL (ref 80.0–100.0)
Platelets: 245 10*3/uL (ref 150–400)
RBC: 4.85 MIL/uL (ref 4.22–5.81)
RDW: 12.7 % (ref 11.5–15.5)
WBC: 9.6 10*3/uL (ref 4.0–10.5)
nRBC: 0 % (ref 0.0–0.2)

## 2022-07-21 LAB — HEMOGLOBIN A1C
Hgb A1c MFr Bld: 6.2 % — ABNORMAL HIGH (ref 4.8–5.6)
Mean Plasma Glucose: 131.24 mg/dL

## 2022-07-21 LAB — COMPREHENSIVE METABOLIC PANEL
ALT: 64 U/L — ABNORMAL HIGH (ref 0–44)
AST: 51 U/L — ABNORMAL HIGH (ref 15–41)
Albumin: 3.8 g/dL (ref 3.5–5.0)
Alkaline Phosphatase: 54 U/L (ref 38–126)
Anion gap: 10 (ref 5–15)
BUN: 18 mg/dL (ref 8–23)
CO2: 24 mmol/L (ref 22–32)
Calcium: 9.4 mg/dL (ref 8.9–10.3)
Chloride: 105 mmol/L (ref 98–111)
Creatinine, Ser: 1.53 mg/dL — ABNORMAL HIGH (ref 0.61–1.24)
GFR, Estimated: 51 mL/min — ABNORMAL LOW (ref >60)
Glucose, Bld: 124 mg/dL — ABNORMAL HIGH (ref 70–99)
Potassium: 4.1 mmol/L (ref 3.5–5.1)
Sodium: 139 mmol/L (ref 135–145)
Total Bilirubin: 1.3 mg/dL — ABNORMAL HIGH (ref 0.3–1.2)
Total Protein: 6.9 g/dL (ref 6.5–8.1)

## 2022-07-21 LAB — ECHO TEE
MV M vel: 5.27 m/s
MV Peak grad: 111.1 mmHg

## 2022-07-21 SURGERY — ECHOCARDIOGRAM, TRANSESOPHAGEAL
Anesthesia: Monitor Anesthesia Care

## 2022-07-21 MED ORDER — SODIUM CHLORIDE 0.9 % WEIGHT BASED INFUSION
3.0000 mL/kg/h | INTRAVENOUS | Status: AC
  Administered 2022-07-22: 3 mL/kg/h via INTRAVENOUS

## 2022-07-21 MED ORDER — SODIUM CHLORIDE 0.9% FLUSH: 3.0000 mL | INTRAVENOUS | Status: DC | PRN

## 2022-07-21 MED ORDER — SODIUM CHLORIDE 0.9 % IV SOLN: INTRAVENOUS | Status: DC

## 2022-07-21 MED ORDER — ZOLPIDEM TARTRATE 5 MG PO TABS: 5.0000 mg | ORAL_TABLET | Freq: Every evening | ORAL | Status: DC | PRN

## 2022-07-21 MED ORDER — PROPOFOL 500 MG/50ML IV EMUL
INTRAVENOUS | Status: DC | PRN
  Administered 2022-07-21: 175 ug/kg/min via INTRAVENOUS

## 2022-07-21 MED ORDER — PROPOFOL 10 MG/ML IV BOLUS
INTRAVENOUS | Status: DC | PRN
  Administered 2022-07-21: 10 mg via INTRAVENOUS
  Administered 2022-07-21: 20 mg via INTRAVENOUS
  Administered 2022-07-21: 10 mg via INTRAVENOUS

## 2022-07-21 MED ORDER — SODIUM CHLORIDE 0.9 % WEIGHT BASED INFUSION
1.0000 mL/kg/h | INTRAVENOUS | Status: DC
  Administered 2022-07-22: 1 mL/kg/h via INTRAVENOUS

## 2022-07-21 MED ORDER — EPHEDRINE SULFATE-NACL 50-0.9 MG/10ML-% IV SOSY
PREFILLED_SYRINGE | INTRAVENOUS | Status: DC | PRN
  Administered 2022-07-21: 5 mg via INTRAVENOUS

## 2022-07-21 MED ORDER — LIDOCAINE VISCOUS HCL 2 % MT SOLN
OROMUCOSAL | Status: AC
  Filled 2022-07-21: qty 15

## 2022-07-21 MED ORDER — ALPRAZOLAM 0.25 MG PO TABS: 0.2500 mg | ORAL_TABLET | Freq: Two times a day (BID) | ORAL | Status: DC | PRN

## 2022-07-21 MED ORDER — LIDOCAINE VISCOUS HCL 2 % MT SOLN
OROMUCOSAL | Status: DC | PRN
  Administered 2022-07-21: 15 mL via OROMUCOSAL

## 2022-07-21 MED ORDER — SODIUM CHLORIDE 0.9 % IV SOLN: 250.0000 mL | INTRAVENOUS | Status: DC | PRN

## 2022-07-21 MED ORDER — SODIUM CHLORIDE 0.9% FLUSH
3.0000 mL | Freq: Two times a day (BID) | INTRAVENOUS | Status: DC
  Administered 2022-07-21 (×2): 3 mL via INTRAVENOUS

## 2022-07-21 MED ORDER — METOPROLOL TARTRATE 50 MG PO TABS
50.0000 mg | ORAL_TABLET | Freq: Two times a day (BID) | ORAL | Status: DC
  Administered 2022-07-21 – 2022-07-22 (×3): 50 mg via ORAL
  Filled 2022-07-21: qty 1
  Filled 2022-07-21: qty 2
  Filled 2022-07-21: qty 1

## 2022-07-21 MED ORDER — PHENYLEPHRINE 80 MCG/ML (10ML) SYRINGE FOR IV PUSH (FOR BLOOD PRESSURE SUPPORT)
PREFILLED_SYRINGE | INTRAVENOUS | Status: DC | PRN
  Administered 2022-07-21 (×4): 80 ug via INTRAVENOUS

## 2022-07-21 NOTE — ED Notes (Signed)
Patient informed RN that he did not want to wear his blood pressure cuff. RN educated on the importance of wearing blood pressure cuff. Patient also turned off monitor at some point and RN educated patient on need for monitoring to be on in patient's room.

## 2022-07-21 NOTE — Transfer of Care (Signed)
Immediate Anesthesia Transfer of Care Note  Patient: Angel Lyons  Procedure(s) Performed: TRANSESOPHAGEAL ECHOCARDIOGRAM (TEE)  Patient Location: Endoscopy Unit  Anesthesia Type:MAC  Level of Consciousness: awake, drowsy, and patient cooperative  Airway & Oxygen Therapy: Patient Spontanous Breathing and Patient connected to nasal cannula oxygen  Post-op Assessment: Report given to RN, Post -op Vital signs reviewed and stable, and Patient moving all extremities X 4  Post vital signs: Reviewed and stable  Last Vitals:  Vitals Value Taken Time  BP    Temp    Pulse    Resp    SpO2      Last Pain:  Vitals:   07/21/22 1028  TempSrc: Temporal  PainSc: 0-No pain         Complications: No notable events documented.

## 2022-07-21 NOTE — Progress Notes (Signed)
  Echocardiogram Echocardiogram Transesophageal has been performed.  Fidel Levy 07/21/2022, 12:11 PM

## 2022-07-21 NOTE — Progress Notes (Addendum)
Rounding Note    Patient Name: Angel Lyons Date of Encounter: 07/21/2022  [facility] Cardiologist: None   Subjective   Feeling well this morning. No recurrent SVT overnight.   Inpatient Medications    Scheduled Meds:  aspirin EC  81 mg Oral Daily   heparin  5,000 Units Subcutaneous Q8H   metoprolol tartrate  25 mg Oral BID   Continuous Infusions:  PRN Meds: acetaminophen, nitroGLYCERIN, ondansetron (ZOFRAN) IV   Vital Signs    Vitals:   07/21/22 0423 07/21/22 0445 07/21/22 0500 07/21/22 0515  BP: 132/76 135/66 (!) 145/108   Pulse: 76 92 75   Resp: 19 14 (!) 21   Temp:    98.2 F (36.8 C)  TempSrc:      SpO2: 98% 95% 96%   Weight:      Height:        Intake/Output Summary (Last 24 hours) at 07/21/2022 0745 Last data filed at 07/20/2022 1030 Gross per 24 hour  Intake 1000 ml  Output --  Net 1000 ml      07/20/2022    9:47 AM 01/20/2017    9:00 PM 03/11/2016   11:13 PM  Last 3 Weights  Weight (lbs) 220 lb 220 lb 230 lb  Weight (kg) 99.791 kg 99.791 kg 104.327 kg      Telemetry    Sinus Rhythm, mostly 90s - Personally Reviewed  ECG    Sinus Rhythm, 80bpm - Personally Reviewed  Physical Exam   GEN: No acute distress.   Neck: No JVD Cardiac: RRR, 2/6 systolic murmur LLSB, no rubs, or gallops.  Respiratory: Clear to auscultation bilaterally. GI: Soft, nontender, non-distended  MS: No edema; No deformity. Neuro:  Nonfocal  Psych: Normal affect   Labs    High Sensitivity Troponin:   Recent Labs  Lab 07/20/22 0847 07/20/22 1124  TROPONINIHS 10 36*     Chemistry Recent Labs  Lab 07/20/22 0847  NA 136  K 3.9  CL 108  CO2 19*  GLUCOSE 254*  BUN 21  CREATININE 1.85*  CALCIUM 8.5*  MG 1.8  PROT 6.3*  ALBUMIN 3.2*  AST 89*  ALT 82*  ALKPHOS 58  BILITOT 0.6  GFRNONAA 40*  ANIONGAP 9    Lipids  Recent Labs  Lab 07/21/22 0515  CHOL 170  TRIG 62  HDL 36*  LDLCALC 122*  CHOLHDL 4.7    Hematology Recent Labs   Lab 07/20/22 0847  WBC 10.6*  RBC 5.02  HGB 14.9  HCT 44.5  MCV 88.6  MCH 29.7  MCHC 33.5  RDW 12.4  PLT 256   Thyroid  Recent Labs  Lab 07/20/22 0847  TSH 2.380    BNP Recent Labs  Lab 07/20/22 0850  BNP 114.4*    DDimer No results for input(s): "DDIMER" in the last 168 hours.   Radiology    ECHOCARDIOGRAM COMPLETE  Result Date: 07/20/2022    ECHOCARDIOGRAM REPORT   Patient Name:   Angel Lyons Date of Exam: 07/20/2022 Medical Rec #:  [id number]        Height:       76.0 in Accession #:    [id number]       Weight:       220.0 lb Date of Birth:  Dec 07, 1960        BSA:          2.307 m Patient Age:    62 years  BP:           177/113 mmHg Patient Gender: M                HR:           99 bpm. Exam Location:  Inpatient Procedure: 2D Echo, Color Doppler and Cardiac Doppler Indications:    R07.9* Chest pain, unspecified  History:        Patient has no prior history of Echocardiogram examinations.                 Risk Factors:Hypertension and Sleep Apnea.  Sonographer:    Irving Burton Senior RDCS Referring Phys: Arty Baumgartner IMPRESSIONS  1. Prolapse of posterior MV leaflet best seen on apical 4 chamber view with moderate to severe MR; suggest TEE to further assess.  2. Left ventricular ejection fraction, by estimation, is 40 to 45%. The left ventricle has mildly decreased function. The left ventricle demonstrates global hypokinesis. There is moderate left ventricular hypertrophy. Left ventricular diastolic parameters are consistent with Grade II diastolic dysfunction (pseudonormalization).  3. Right ventricular systolic function is moderately reduced. The right ventricular size is moderately enlarged. There is mildly elevated pulmonary artery systolic pressure.  4. Left atrial size was moderately dilated.  5. Right atrial size was moderately dilated.  6. The mitral valve is normal in structure. Moderate to severe mitral valve regurgitation. No evidence of mitral stenosis. 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  7. The aortic valve is tricuspid. Aortic valve regurgitation is not visualized. Aortic valve sclerosis/calcification is present, without any evidence of aortic stenosis.  8. The inferior vena cava is dilated in size with <50% respiratory variability, suggesting right atrial pressure of 15 mmHg. Comparison(s): No prior Echocardiogram. FINDINGS  Left Ventricle: Left ventricular ejection fraction, by estimation, is 40 to 45%. The left ventricle has mildly decreased function. The left ventricle demonstrates global hypokinesis. The left ventricular internal cavity size was normal in size. There is  moderate left ventricular hypertrophy. Left ventricular diastolic parameters are consistent with Grade II diastolic dysfunction (pseudonormalization). Right Ventricle: The right ventricular size is moderately enlarged. Right ventricular systolic function is moderately reduced. There is mildly elevated pulmonary artery systolic pressure. The tricuspid regurgitant velocity is 2.54 m/s, and with an assumed right atrial pressure of 15 mmHg, the estimated right ventricular systolic pressure is 40.8 mmHg. Left Atrium: Left atrial size was moderately dilated. Right Atrium: Right atrial size was moderately dilated. Pericardium: There is no evidence of pericardial effusion. Mitral Valve: The mitral valve is normal in structure. 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 Moderate to severe mitral valve regurgitation. No evidence of mitral valve stenosis. Tricuspid Valve: The tricuspid valve is normal in structure. Tricuspid valve regurgitation is mild . No evidence of tricuspid stenosis. Aortic Valve: The aortic valve is tricuspid. Aortic valve regurgitation is not visualized. Aortic valve sclerosis/calcification is present, without any evidence of aortic stenosis. Pulmonic Valve: The pulmonic valve was normal in structure.  Pulmonic valve regurgitation is not visualized. No evidence of pulmonic stenosis. Aorta: The aortic root is normal in size and structure. Venous: The inferior vena cava is dilated in size with less than 50% respiratory variability, suggesting right atrial pressure of 15 mmHg. IAS/Shunts: No atrial level shunt detected by color flow Doppler. Additional Comments: Prolapse of posterior MV leaflet best seen on apical 4 chamber view with moderate to severe MR; suggest TEE to further assess.  LEFT VENTRICLE PLAX 2D LVIDd:         5.60 cm      Diastology LVIDs:         4.60 cm      LV e' medial:    5.77 cm/s LV PW:         0.90 cm      LV E/e' medial:  11.5 LV IVS:        0.90 cm      LV e' lateral:   9.36 cm/s LVOT diam:     2.30 cm      LV E/e' lateral: 7.1 LV SV:         51 LV SV Index:   22 LVOT Area:     4.15 cm  LV Volumes (MOD) LV vol d, MOD A2C: 158.0 ml LV vol d, MOD A4C: 161.0 ml LV vol s, MOD A2C: 93.8 ml LV vol s, MOD A4C: 104.0 ml LV SV MOD A2C:     64.2 ml LV SV MOD A4C:     161.0 ml LV SV MOD BP:      59.9 ml RIGHT VENTRICLE RV S prime:     9.14 cm/s TAPSE (M-mode): 1.3 cm LEFT ATRIUM             Index        RIGHT ATRIUM           Index LA diam:        3.70 cm 1.60 cm/m   RA Area:     27.50 cm LA Vol (A2C):   95.6 ml 41.44 ml/m  RA Volume:   104.00 ml 45.08 ml/m LA Vol (A4C):   82.4 ml 35.72 ml/m LA Biplane Vol: 95.4 ml 41.36 ml/m  AORTIC VALVE LVOT Vmax:   73.10 cm/s LVOT Vmean:  59.200 cm/s LVOT VTI:    0.123 m  AORTA Ao Root diam: 3.30 cm Ao Asc diam:  3.70 cm MITRAL VALVE                  TRICUSPID VALVE MV Area (PHT): 1.85 cm       TR Peak grad:   25.8 mmHg MV Decel Time: 410 msec       TR Vmax:        254.00 cm/s MR Peak grad:    109.0 mmHg MR Mean grad:    65.0 mmHg    SHUNTS MR Vmax:         522.00 cm/s  Systemic VTI:  0.12 m MR Vmean:        378.0 cm/s   Systemic Diam: 2.30 cm MR PISA:         6.28 cm MR PISA Eff ROA: 38 mm MR PISA Radius:  1.00 cm MV E velocity: 66.60 cm/s MV A velocity:  31.90 cm/s MV E/A ratio:  2.09 Kirk Ruths MD Electronically signed by Kirk Ruths MD Signature Date/Time: 07/20/2022/3:49:06 PM    Final    DG Chest Portable 1 View  Result Date: 07/20/2022 CLINICAL DATA:  SVT.  Shortness of breath and cough EXAM: PORTABLE CHEST 1 VIEW COMPARISON:  7/31/7 FINDINGS: Cardiac enlargement. There is no pleural effusion or edema. No airspace opacities identified. Review of the visualized osseous structures is unremarkable. IMPRESSION: No acute cardiopulmonary abnormalities. Cardiac enlargement Electronically Signed   By: Kerby Moors M.D.   On: 07/20/2022 09:13    Cardiac Studies   Echo: 07/20/2022  IMPRESSIONS     1. Prolapse of  posterior MV leaflet best seen on apical 4 chamber view  with moderate to severe MR; suggest TEE to further assess.   2. Left ventricular ejection fraction, by estimation, is 40 to 45%. The  left ventricle has mildly decreased function. The left ventricle  demonstrates global hypokinesis. There is moderate left ventricular  hypertrophy. Left ventricular diastolic  parameters are consistent with Grade II diastolic dysfunction  (pseudonormalization).   3. Right ventricular systolic function is moderately reduced. The right  ventricular size is moderately enlarged. There is mildly elevated  pulmonary artery systolic pressure.   4. Left atrial size was moderately dilated.   5. Right atrial size was moderately dilated.   6. The mitral valve is normal in structure. Moderate to severe mitral  valve regurgitation. No evidence of mitral stenosis. 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   7. The aortic valve is tricuspid. Aortic valve regurgitation is not  visualized. Aortic valve sclerosis/calcification is present, without any  evidence of aortic stenosis.   8. The inferior vena cava is dilated in size with <50% respiratory  variability, suggesting right atrial pressure of 15 mmHg.    Comparison(s): No prior Echocardiogram.   FINDINGS   Left Ventricle: Left ventricular ejection fraction, by estimation, is 40  to 45%. The left ventricle has mildly decreased function. The left  ventricle demonstrates global hypokinesis. The left ventricular internal  cavity size was normal in size. There is   moderate left ventricular hypertrophy. Left ventricular diastolic  parameters are consistent with Grade II diastolic dysfunction  (pseudonormalization).   Right Ventricle: The right ventricular size is moderately enlarged. Right  ventricular systolic function is moderately reduced. There is mildly  elevated pulmonary artery systolic pressure. The tricuspid regurgitant  velocity is 2.54 m/s, and with an  assumed right atrial pressure of 15 mmHg, the estimated right ventricular  systolic pressure is 10.9 mmHg.   Left Atrium: Left atrial size was moderately dilated.   Right Atrium: Right atrial size was moderately dilated.   Pericardium: There is no evidence of pericardial effusion.   Mitral Valve: The mitral valve is normal in structure. 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 Moderate  to severe mitral valve regurgitation. No evidence of mitral valve  stenosis.   Tricuspid Valve: The tricuspid valve is normal in structure. Tricuspid  valve regurgitation is mild . No evidence of tricuspid stenosis.   Aortic Valve: The aortic valve is tricuspid. Aortic valve regurgitation is  not visualized. Aortic valve sclerosis/calcification is present, without  any evidence of aortic stenosis.   Pulmonic Valve: The pulmonic valve was normal in structure. Pulmonic valve  regurgitation is not visualized. No evidence of pulmonic stenosis.   Aorta: The aortic root is normal in size and structure.   Venous: The inferior vena cava is dilated in size with less than 50%  respiratory variability, suggesting right atrial pressure of 15 mmHg.    IAS/Shunts: No atrial level shunt detected by color flow Doppler.   Additional Comments: Prolapse of posterior MV leaflet best seen on apical  4 chamber view with moderate to severe MR; suggest TEE to further assess.   Patient Profile     62 y.o. male with a hx of HTN and OSA who is being seen 07/20/2022 for the evaluation of chest pain/SVT at the request of Dr. Sherry Ruffing.   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  Was referred to cardiology as an outpatient by PCP but has not had a visit yet. hsTn 10>>36 though in the setting of SVT -- question whether episodes are related to arrhythmia? -- echo showed LVEF of 40-45%, global hypokinesis, g2DD with moderate to severe MR, moderate biatrial enlargement. 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 Belcher and he is willing to proceed.     SVT -- Developed episode the morning of 1/17 while climbing steps at work in the cold.  Did not respond to vagal maneuvers or adenosine.  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 EP  consulted, rec's to avoid amiodarone with plans for EP study/ablation pending work up   HTN -- Reports he was previously on antihypertensives but blood pressure improved and he has not been on any recently. Blood pressures elevated this morning -- increase metoprolol 50mg  BID    PreDM -- Hgb A1c 6.2   Elevated LFTs -- Denies any history of EtOH use -- CMET pending   CKD vs AKI -- Cr 1.85, unclear baseline -- given 1L NS in the ED -- BMET pending    For questions or updates, please contact [facility] Please consult [email or web address] for contact info under        Signed, , NP  07/21/2022, 7:45 AM    I have examined the patient and reviewed assessment and plan and discussed with patient.  Agree with above as stated.    Creatinine improved this morning down to 1.53.  Patient feels well.  He is stressed about this unforeseen cardiac issue.  I tried to reassure him that his problems both with the SVT as well as the mitral valve are fixable.  Plan for TEE later today.  This will get a better sense of whether or not he will need repair in the near future.  I explained to him that neck step would likely be ablation of the suspected one-to-one atrial flutter.  Depending on the severity of his mitral valve regurgitation, he may need right and left heart cath, but I think that is a lower priority than his SVT.  Laverda Page

## 2022-07-21 NOTE — Anesthesia Preprocedure Evaluation (Signed)
Anesthesia Evaluation  Patient identified by MRN, date of birth, ID band Patient awake    Reviewed: Allergy & Precautions, NPO status , Patient's Chart, lab work & pertinent test results, reviewed documented beta blocker date and time   Airway Mallampati: II  TM Distance: >3 FB Neck ROM: Full    Dental no notable dental hx. (+) Dental Advisory Given   Pulmonary sleep apnea and Continuous Positive Airway Pressure Ventilation    Pulmonary exam normal breath sounds clear to auscultation       Cardiovascular hypertension, Pt. on medications Normal cardiovascular exam+ Valvular Problems/Murmurs MR  Rhythm:Regular Rate:Normal     Neuro/Psych negative neurological ROS  negative psych ROS   GI/Hepatic negative GI ROS, Neg liver ROS,,,  Endo/Other  Obesity   Renal/GU negative Renal ROS  negative genitourinary   Musculoskeletal negative musculoskeletal ROS (+)    Abdominal  (+) + obese  Peds  Hematology negative hematology ROS (+)   Anesthesia Other Findings   Reproductive/Obstetrics                             Anesthesia Physical Anesthesia Plan  ASA: 3  Anesthesia Plan: MAC   Post-op Pain Management: Minimal or no pain anticipated   Induction: Intravenous  PONV Risk Score and Plan: 1 and Treatment may vary due to age or medical condition and Propofol infusion  Airway Management Planned: Natural Airway and Nasal Cannula  Additional Equipment: None  Intra-op Plan:   Post-operative Plan:   Informed Consent: I have reviewed the patients History and Physical, chart, labs and discussed the procedure including the risks, benefits and alternatives for the proposed anesthesia with the patient or authorized representative who has indicated his/her understanding and acceptance.     Dental advisory given  Plan Discussed with: Anesthesiologist and CRNA  Anesthesia Plan Comments:         Anesthesia Quick Evaluation

## 2022-07-21 NOTE — Interval H&P Note (Signed)
History and Physical Interval Note:  07/21/2022 10:44 AM  Angel Lyons  has presented today for surgery, with the diagnosis of [place].  The various methods of treatment have been discussed with the patient and family. After consideration of risks, benefits and other options for treatment, the patient has consented to  Procedure(s): TRANSESOPHAGEAL ECHOCARDIOGRAM (TEE) (N/A) as a surgical intervention.  The patient's history has been reviewed, patient examined, no change in status, stable for surgery.  I have reviewed the patient's chart and labs.  Questions were answered to the patient's satisfaction.     Dorris Carnes

## 2022-07-21 NOTE — CV Procedure (Signed)
TEE  Indication:    Mitral regurgitation  Patient sedated by anesthesia with propofol intravenously Patient gargled viscous lidocaine to numb throat    Bite guard placed  TEE probe advanced through mouth to mid esophagus without difficulty   There is prolapse of the posterior leaflet of the mitral valve (P2 segment) with partial noncoaptation.   MR is eccentric, directed more anteirorly with some splaying of the regurgitant signal.  There is a small amount of back flow into R upper pulmonary vein consistent with severe MR (BP 93 mm Hg during exam)  TV  Mild to moderate TR  AV tricuspid   Trivial AI  LA, LAA without masses  LVEF is low normal  RVEF is mildy reduced     Full report to follow in CV section of chart  Procedure was without complication.  Dorris Carnes, MD

## 2022-07-21 NOTE — H&P (View-Only) (Signed)
 Rounding Note    Patient Name: Angel Lyons Date of Encounter: 07/21/2022  [facility] Cardiologist: None   Subjective   Feeling well this morning. No recurrent SVT overnight.   Inpatient Medications    Scheduled Meds:  aspirin EC  81 mg Oral Daily   heparin  5,000 Units Subcutaneous Q8H   metoprolol tartrate  25 mg Oral BID   Continuous Infusions:  PRN Meds: acetaminophen, nitroGLYCERIN, ondansetron (ZOFRAN) IV   Vital Signs    Vitals:   07/21/22 0423 07/21/22 0445 07/21/22 0500 07/21/22 0515  BP: 132/76 135/66 (!) 145/108   Pulse: 76 92 75   Resp: 19 14 (!) 21   Temp:    98.2 F (36.8 C)  TempSrc:      SpO2: 98% 95% 96%   Weight:      Height:        Intake/Output Summary (Last 24 hours) at 07/21/2022 0745 Last data filed at 07/20/2022 1030 Gross per 24 hour  Intake 1000 ml  Output --  Net 1000 ml      07/20/2022    9:47 AM 01/20/2017    9:00 PM 03/11/2016   11:13 PM  Last 3 Weights  Weight (lbs) 220 lb 220 lb 230 lb  Weight (kg) 99.791 kg 99.791 kg 104.327 kg      Telemetry    Sinus Rhythm, mostly 90s - Personally Reviewed  ECG    Sinus Rhythm, 80bpm - Personally Reviewed  Physical Exam   GEN: No acute distress.   Neck: No JVD Cardiac: RRR, 2/6 systolic murmur LLSB, no rubs, or gallops.  Respiratory: Clear to auscultation bilaterally. GI: Soft, nontender, non-distended  MS: No edema; No deformity. Neuro:  Nonfocal  Psych: Normal affect   Labs    High Sensitivity Troponin:   Recent Labs  Lab 07/20/22 0847 07/20/22 1124  TROPONINIHS 10 36*     Chemistry Recent Labs  Lab 07/20/22 0847  NA 136  K 3.9  CL 108  CO2 19*  GLUCOSE 254*  BUN 21  CREATININE 1.85*  CALCIUM 8.5*  MG 1.8  PROT 6.3*  ALBUMIN 3.2*  AST 89*  ALT 82*  ALKPHOS 58  BILITOT 0.6  GFRNONAA 40*  ANIONGAP 9    Lipids  Recent Labs  Lab 07/21/22 0515  CHOL 170  TRIG 62  HDL 36*  LDLCALC 122*  CHOLHDL 4.7    Hematology Recent Labs   Lab 07/20/22 0847  WBC 10.6*  RBC 5.02  HGB 14.9  HCT 44.5  MCV 88.6  MCH 29.7  MCHC 33.5  RDW 12.4  PLT 256   Thyroid  Recent Labs  Lab 07/20/22 0847  TSH 2.380    BNP Recent Labs  Lab 07/20/22 0850  BNP 114.4*    DDimer No results for input(s): "DDIMER" in the last 168 hours.   Radiology    ECHOCARDIOGRAM COMPLETE  Result Date: 07/20/2022    ECHOCARDIOGRAM REPORT   Patient Name:   Angel Lyons Date of Exam: 07/20/2022 Medical Rec #:  [id number]        Height:       76.0 in Accession #:    [id number]       Weight:       220.0 lb Date of Birth:  01/12/1961        BSA:          2.307 m Patient Age:    62 years           BP:           177/113 mmHg Patient Gender: M                HR:           99 bpm. Exam Location:  Inpatient Procedure: 2D Echo, Color Doppler and Cardiac Doppler Indications:    R07.9* Chest pain, unspecified  History:        Patient has no prior history of Echocardiogram examinations.                 Risk Factors:Hypertension and Sleep Apnea.  Sonographer:    Irving Burton Senior RDCS Referring Phys: Arty Baumgartner IMPRESSIONS  1. Prolapse of posterior MV leaflet best seen on apical 4 chamber view with moderate to severe MR; suggest TEE to further assess.  2. Left ventricular ejection fraction, by estimation, is 40 to 45%. The left ventricle has mildly decreased function. The left ventricle demonstrates global hypokinesis. There is moderate left ventricular hypertrophy. Left ventricular diastolic parameters are consistent with Grade II diastolic dysfunction (pseudonormalization).  3. Right ventricular systolic function is moderately reduced. The right ventricular size is moderately enlarged. There is mildly elevated pulmonary artery systolic pressure.  4. Left atrial size was moderately dilated.  5. Right atrial size was moderately dilated.  6. The mitral valve is normal in structure. Moderate to severe mitral valve regurgitation. No evidence of mitral stenosis. 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  7. The aortic valve is tricuspid. Aortic valve regurgitation is not visualized. Aortic valve sclerosis/calcification is present, without any evidence of aortic stenosis.  8. The inferior vena cava is dilated in size with <50% respiratory variability, suggesting right atrial pressure of 15 mmHg. Comparison(s): No prior Echocardiogram. FINDINGS  Left Ventricle: Left ventricular ejection fraction, by estimation, is 40 to 45%. The left ventricle has mildly decreased function. The left ventricle demonstrates global hypokinesis. The left ventricular internal cavity size was normal in size. There is  moderate left ventricular hypertrophy. Left ventricular diastolic parameters are consistent with Grade II diastolic dysfunction (pseudonormalization). Right Ventricle: The right ventricular size is moderately enlarged. Right ventricular systolic function is moderately reduced. There is mildly elevated pulmonary artery systolic pressure. The tricuspid regurgitant velocity is 2.54 m/s, and with an assumed right atrial pressure of 15 mmHg, the estimated right ventricular systolic pressure is 40.8 mmHg. Left Atrium: Left atrial size was moderately dilated. Right Atrium: Right atrial size was moderately dilated. Pericardium: There is no evidence of pericardial effusion. Mitral Valve: The mitral valve is normal in structure. 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 Moderate to severe mitral valve regurgitation. No evidence of mitral valve stenosis. Tricuspid Valve: The tricuspid valve is normal in structure. Tricuspid valve regurgitation is mild . No evidence of tricuspid stenosis. Aortic Valve: The aortic valve is tricuspid. Aortic valve regurgitation is not visualized. Aortic valve sclerosis/calcification is present, without any evidence of aortic stenosis. Pulmonic Valve: The pulmonic valve was normal in structure.  Pulmonic valve regurgitation is not visualized. No evidence of pulmonic stenosis. Aorta: The aortic root is normal in size and structure. Venous: The inferior vena cava is dilated in size with less than 50% respiratory variability, suggesting right atrial pressure of 15 mmHg. IAS/Shunts: No atrial level shunt detected by color flow Doppler. Additional Comments: Prolapse of posterior MV leaflet best seen on apical 4 chamber view with moderate to severe MR; suggest TEE to further assess.  LEFT VENTRICLE PLAX 2D LVIDd:         5.60 cm      Diastology LVIDs:         4.60 cm      LV e' medial:    5.77 cm/s LV PW:         0.90 cm      LV E/e' medial:  11.5 LV IVS:        0.90 cm      LV e' lateral:   9.36 cm/s LVOT diam:     2.30 cm      LV E/e' lateral: 7.1 LV SV:         51 LV SV Index:   22 LVOT Area:     4.15 cm  LV Volumes (MOD) LV vol d, MOD A2C: 158.0 ml LV vol d, MOD A4C: 161.0 ml LV vol s, MOD A2C: 93.8 ml LV vol s, MOD A4C: 104.0 ml LV SV MOD A2C:     64.2 ml LV SV MOD A4C:     161.0 ml LV SV MOD BP:      59.9 ml RIGHT VENTRICLE RV S prime:     9.14 cm/s TAPSE (M-mode): 1.3 cm LEFT ATRIUM             Index        RIGHT ATRIUM           Index LA diam:        3.70 cm 1.60 cm/m   RA Area:     27.50 cm LA Vol (A2C):   95.6 ml 41.44 ml/m  RA Volume:   104.00 ml 45.08 ml/m LA Vol (A4C):   82.4 ml 35.72 ml/m LA Biplane Vol: 95.4 ml 41.36 ml/m  AORTIC VALVE LVOT Vmax:   73.10 cm/s LVOT Vmean:  59.200 cm/s LVOT VTI:    0.123 m  AORTA Ao Root diam: 3.30 cm Ao Asc diam:  3.70 cm MITRAL VALVE                  TRICUSPID VALVE MV Area (PHT): 1.85 cm       TR Peak grad:   25.8 mmHg MV Decel Time: 410 msec       TR Vmax:        254.00 cm/s MR Peak grad:    109.0 mmHg MR Mean grad:    65.0 mmHg    SHUNTS MR Vmax:         522.00 cm/s  Systemic VTI:  0.12 m MR Vmean:        378.0 cm/s   Systemic Diam: 2.30 cm MR PISA:         6.28 cm MR PISA Eff ROA: 38 mm MR PISA Radius:  1.00 cm MV E velocity: 66.60 cm/s MV A velocity:  31.90 cm/s MV E/A ratio:  2.09 Kirk Ruths MD Electronically signed by Kirk Ruths MD Signature Date/Time: 07/20/2022/3:49:06 PM    Final    DG Chest Portable 1 View  Result Date: 07/20/2022 CLINICAL DATA:  SVT.  Shortness of breath and cough EXAM: PORTABLE CHEST 1 VIEW COMPARISON:  7/31/7 FINDINGS: Cardiac enlargement. There is no pleural effusion or edema. No airspace opacities identified. Review of the visualized osseous structures is unremarkable. IMPRESSION: No acute cardiopulmonary abnormalities. Cardiac enlargement Electronically Signed   By: Kerby Moors M.D.   On: 07/20/2022 09:13    Cardiac Studies   Echo: 07/20/2022  IMPRESSIONS     1. Prolapse of  posterior MV leaflet best seen on apical 4 chamber view  with moderate to severe MR; suggest TEE to further assess.   2. Left ventricular ejection fraction, by estimation, is 40 to 45%. The  left ventricle has mildly decreased function. The left ventricle  demonstrates global hypokinesis. There is moderate left ventricular  hypertrophy. Left ventricular diastolic  parameters are consistent with Grade II diastolic dysfunction  (pseudonormalization).   3. Right ventricular systolic function is moderately reduced. The right  ventricular size is moderately enlarged. There is mildly elevated  pulmonary artery systolic pressure.   4. Left atrial size was moderately dilated.   5. Right atrial size was moderately dilated.   6. The mitral valve is normal in structure. Moderate to severe mitral  valve regurgitation. No evidence of mitral stenosis. 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   7. The aortic valve is tricuspid. Aortic valve regurgitation is not  visualized. Aortic valve sclerosis/calcification is present, without any  evidence of aortic stenosis.   8. The inferior vena cava is dilated in size with <50% respiratory  variability, suggesting right atrial pressure of 15 mmHg.    Comparison(s): No prior Echocardiogram.   FINDINGS   Left Ventricle: Left ventricular ejection fraction, by estimation, is 40  to 45%. The left ventricle has mildly decreased function. The left  ventricle demonstrates global hypokinesis. The left ventricular internal  cavity size was normal in size. There is   moderate left ventricular hypertrophy. Left ventricular diastolic  parameters are consistent with Grade II diastolic dysfunction  (pseudonormalization).   Right Ventricle: The right ventricular size is moderately enlarged. Right  ventricular systolic function is moderately reduced. There is mildly  elevated pulmonary artery systolic pressure. The tricuspid regurgitant  velocity is 2.54 m/s, and with an  assumed right atrial pressure of 15 mmHg, the estimated right ventricular  systolic pressure is 10.9 mmHg.   Left Atrium: Left atrial size was moderately dilated.   Right Atrium: Right atrial size was moderately dilated.   Pericardium: There is no evidence of pericardial effusion.   Mitral Valve: The mitral valve is normal in structure. 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 Moderate  to severe mitral valve regurgitation. No evidence of mitral valve  stenosis.   Tricuspid Valve: The tricuspid valve is normal in structure. Tricuspid  valve regurgitation is mild . No evidence of tricuspid stenosis.   Aortic Valve: The aortic valve is tricuspid. Aortic valve regurgitation is  not visualized. Aortic valve sclerosis/calcification is present, without  any evidence of aortic stenosis.   Pulmonic Valve: The pulmonic valve was normal in structure. Pulmonic valve  regurgitation is not visualized. No evidence of pulmonic stenosis.   Aorta: The aortic root is normal in size and structure.   Venous: The inferior vena cava is dilated in size with less than 50%  respiratory variability, suggesting right atrial pressure of 15 mmHg.    IAS/Shunts: No atrial level shunt detected by color flow Doppler.   Additional Comments: Prolapse of posterior MV leaflet best seen on apical  4 chamber view with moderate to severe MR; suggest TEE to further assess.   Patient Profile     62 y.o. male with a hx of HTN and OSA who is being seen 07/20/2022 for the evaluation of chest pain/SVT at the request of Dr. Sherry Ruffing.   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  Was referred to cardiology as an outpatient by PCP but has not had a visit yet. hsTn 10>>36 though in the setting of SVT -- question whether episodes are related to arrhythmia? -- echo showed LVEF of 40-45%, global hypokinesis, g2DD with moderate to severe MR, moderate biatrial enlargement. 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 Kistler and he is willing to proceed.     SVT -- Developed episode the morning of 1/17 while climbing steps at work in the cold.  Did not respond to vagal maneuvers or adenosine.  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 EP  consulted, rec's to avoid amiodarone with plans for EP study/ablation pending work up   HTN -- Reports he was previously on antihypertensives but blood pressure improved and he has not been on any recently. Blood pressures elevated this morning -- increase metoprolol 50mg BID    PreDM -- Hgb A1c 6.2   Elevated LFTs -- Denies any history of EtOH use -- CMET pending   CKD vs AKI -- Cr 1.85, unclear baseline -- given 1L NS in the ED -- BMET pending    For questions or updates, please contact [facility] Please consult [email or web address] for contact info under        Signed, Lindsay Roberts, NP  07/21/2022, 7:45 AM    I have examined the patient and reviewed assessment and plan and discussed with patient.  Agree with above as stated.    Creatinine improved this morning down to 1.53.  Patient feels well.  He is stressed about this unforeseen cardiac issue.  I tried to reassure him that his problems both with the SVT as well as the mitral valve are fixable.  Plan for TEE later today.  This will get us a better sense of whether or not he will need repair in the near future.  I explained to him that neck step would likely be ablation of the suspected one-to-one atrial flutter.  Depending on the severity of his mitral valve regurgitation, he may need right and left heart cath, but I think that is a lower priority than his SVT.  Tanaisha Pittman  

## 2022-07-21 NOTE — Progress Notes (Signed)
    Patient with moderate to severe mitral regurgitation and prolapse of posterior MV leaflet on TTE. Underwent TEE today with official report pending. Will need [facility] cath to further evaluate coronary arteries. On schedule tomorrow for 9am. NPO at midnight. Orders placed.   Shared Decision Making/Informed Consent{ The risks [stroke (1 in 1000), death (1 in 1000), kidney failure [usually temporary] (1 in 500), bleeding (1 in 200), allergic reaction [possibly serious] (1 in 200)], benefits (diagnostic support and management of coronary artery disease) and alternatives of a cardiac catheterization were discussed in detail with Angel Lyons and he is willing to proceed.  SignedReino Bellis, NP-C 07/21/2022, 2:28 PM Pager: [phone]

## 2022-07-21 NOTE — ED Notes (Signed)
The pt is sleeping soundly nsr on the monitor

## 2022-07-21 NOTE — ED Notes (Signed)
Pt up to the bathroom

## 2022-07-21 NOTE — Anesthesia Postprocedure Evaluation (Signed)
Anesthesia Post Note  Patient: Angel Lyons  Procedure(s) Performed: TRANSESOPHAGEAL ECHOCARDIOGRAM (TEE)     Patient location during evaluation: PACU Anesthesia Type: MAC Level of consciousness: awake and alert and oriented Pain management: pain level controlled Vital Signs Assessment: post-procedure vital signs reviewed and stable Respiratory status: spontaneous breathing, nonlabored ventilation and respiratory function stable Cardiovascular status: stable and blood pressure returned to baseline Postop Assessment: no apparent nausea or vomiting Anesthetic complications: no   No notable events documented.  Last Vitals:  Vitals:   07/21/22 1135 07/21/22 1150  BP: 100/62 100/65  Pulse: 74 67  Resp: 19 20  Temp:    SpO2: 95% 96%    Last Pain:  Vitals:   07/21/22 1150  TempSrc:   PainSc: Asleep                 Levii Hairfield A.

## 2022-07-22 ENCOUNTER — Encounter (HOSPITAL_COMMUNITY)
Admission: EM | Disposition: A | Discharge status: Home / Self Care | Payer: Self-pay | Source: Home / Self Care | Attending: Interventional Cardiology

## 2022-07-22 ENCOUNTER — Other Ambulatory Visit (HOSPITAL_COMMUNITY): Payer: Self-pay

## 2022-07-22 ENCOUNTER — Other Ambulatory Visit: Payer: Self-pay | Admitting: Thoracic Surgery (Cardiothoracic Vascular Surgery)

## 2022-07-22 ENCOUNTER — Encounter (HOSPITAL_COMMUNITY): Payer: Self-pay | Admitting: Interventional Cardiology

## 2022-07-22 DIAGNOSIS — I110 Hypertensive heart disease with heart failure: Secondary | ICD-10-CM | POA: Diagnosis present

## 2022-07-22 DIAGNOSIS — Z6834 Body mass index (BMI) 34.0-34.9, adult: Secondary | ICD-10-CM | POA: Diagnosis not present

## 2022-07-22 DIAGNOSIS — Z1152 Encounter for screening for COVID-19: Secondary | ICD-10-CM | POA: Diagnosis not present

## 2022-07-22 DIAGNOSIS — E785 Hyperlipidemia, unspecified: Secondary | ICD-10-CM | POA: Diagnosis present

## 2022-07-22 DIAGNOSIS — I429 Cardiomyopathy, unspecified: Secondary | ICD-10-CM | POA: Diagnosis not present

## 2022-07-22 DIAGNOSIS — I2510 Atherosclerotic heart disease of native coronary artery without angina pectoris: Secondary | ICD-10-CM | POA: Diagnosis present

## 2022-07-22 DIAGNOSIS — I340 Nonrheumatic mitral (valve) insufficiency: Secondary | ICD-10-CM | POA: Diagnosis present

## 2022-07-22 DIAGNOSIS — R7401 Elevation of levels of liver transaminase levels: Secondary | ICD-10-CM | POA: Diagnosis present

## 2022-07-22 DIAGNOSIS — I4892 Unspecified atrial flutter: Secondary | ICD-10-CM | POA: Diagnosis present

## 2022-07-22 DIAGNOSIS — R0602 Shortness of breath: Secondary | ICD-10-CM | POA: Diagnosis present

## 2022-07-22 DIAGNOSIS — I5041 Acute combined systolic (congestive) and diastolic (congestive) heart failure: Secondary | ICD-10-CM | POA: Diagnosis present

## 2022-07-22 DIAGNOSIS — Z8249 Family history of ischemic heart disease and other diseases of the circulatory system: Secondary | ICD-10-CM | POA: Diagnosis not present

## 2022-07-22 DIAGNOSIS — I471 Supraventricular tachycardia, unspecified: Secondary | ICD-10-CM | POA: Diagnosis not present

## 2022-07-22 DIAGNOSIS — I272 Pulmonary hypertension, unspecified: Secondary | ICD-10-CM | POA: Diagnosis present

## 2022-07-22 DIAGNOSIS — I493 Ventricular premature depolarization: Secondary | ICD-10-CM | POA: Diagnosis present

## 2022-07-22 DIAGNOSIS — G4733 Obstructive sleep apnea (adult) (pediatric): Secondary | ICD-10-CM | POA: Diagnosis present

## 2022-07-22 DIAGNOSIS — I4729 Other ventricular tachycardia: Secondary | ICD-10-CM

## 2022-07-22 DIAGNOSIS — N183 Chronic kidney disease, stage 3 unspecified: Secondary | ICD-10-CM | POA: Diagnosis not present

## 2022-07-22 DIAGNOSIS — I361 Nonrheumatic tricuspid (valve) insufficiency: Secondary | ICD-10-CM

## 2022-07-22 DIAGNOSIS — Z79899 Other long term (current) drug therapy: Secondary | ICD-10-CM | POA: Diagnosis not present

## 2022-07-22 DIAGNOSIS — R739 Hyperglycemia, unspecified: Secondary | ICD-10-CM | POA: Diagnosis present

## 2022-07-22 DIAGNOSIS — E669 Obesity, unspecified: Secondary | ICD-10-CM | POA: Diagnosis present

## 2022-07-22 DIAGNOSIS — I25119 Atherosclerotic heart disease of native coronary artery with unspecified angina pectoris: Secondary | ICD-10-CM

## 2022-07-22 DIAGNOSIS — N179 Acute kidney failure, unspecified: Secondary | ICD-10-CM | POA: Diagnosis present

## 2022-07-22 DIAGNOSIS — R079 Chest pain, unspecified: Secondary | ICD-10-CM | POA: Diagnosis not present

## 2022-07-22 DIAGNOSIS — R7303 Prediabetes: Secondary | ICD-10-CM | POA: Diagnosis present

## 2022-07-22 HISTORY — PX: RIGHT/LEFT HEART CATH AND CORONARY ANGIOGRAPHY: CATH118266

## 2022-07-22 LAB — GLUCOSE, CAPILLARY: Glucose-Capillary: 102 mg/dL — ABNORMAL HIGH (ref 70–99)

## 2022-07-22 LAB — BASIC METABOLIC PANEL
Anion gap: 10 (ref 5–15)
BUN: 19 mg/dL (ref 8–23)
CO2: 21 mmol/L — ABNORMAL LOW (ref 22–32)
Calcium: 8.9 mg/dL (ref 8.9–10.3)
Chloride: 108 mmol/L (ref 98–111)
Creatinine, Ser: 1.36 mg/dL — ABNORMAL HIGH (ref 0.61–1.24)
GFR, Estimated: 58 mL/min — ABNORMAL LOW (ref >60)
Glucose, Bld: 111 mg/dL — ABNORMAL HIGH (ref 70–99)
Potassium: 4.1 mmol/L (ref 3.5–5.1)
Sodium: 139 mmol/L (ref 135–145)

## 2022-07-22 LAB — CBC
HCT: 43.6 % (ref 39.0–52.0)
Hemoglobin: 13.9 g/dL (ref 13.0–17.0)
MCH: 29.3 pg (ref 26.0–34.0)
MCHC: 31.9 g/dL (ref 30.0–36.0)
MCV: 92 fL (ref 80.0–100.0)
Platelets: 213 10*3/uL (ref 150–400)
RBC: 4.74 MIL/uL (ref 4.22–5.81)
RDW: 12.6 % (ref 11.5–15.5)
WBC: 9.3 10*3/uL (ref 4.0–10.5)
nRBC: 0 % (ref 0.0–0.2)

## 2022-07-22 LAB — HEPATIC FUNCTION PANEL
ALT: 54 U/L — ABNORMAL HIGH (ref 0–44)
AST: 40 U/L (ref 15–41)
Albumin: 3.7 g/dL (ref 3.5–5.0)
Alkaline Phosphatase: 48 U/L (ref 38–126)
Bilirubin, Direct: 0.2 mg/dL (ref 0.0–0.2)
Indirect Bilirubin: 1.3 mg/dL — ABNORMAL HIGH (ref 0.3–0.9)
Total Bilirubin: 1.5 mg/dL — ABNORMAL HIGH (ref 0.3–1.2)
Total Protein: 6.8 g/dL (ref 6.5–8.1)

## 2022-07-22 LAB — CREATININE, SERUM
Creatinine, Ser: 1.4 mg/dL — ABNORMAL HIGH (ref 0.61–1.24)
GFR, Estimated: 56 mL/min — ABNORMAL LOW (ref >60)

## 2022-07-22 LAB — POCT I-STAT 7, (LYTES, BLD GAS, ICA,H+H)
Acid-base deficit: 4 mmol/L — ABNORMAL HIGH (ref 0.0–2.0)
Bicarbonate: 20.7 mmol/L (ref 20.0–28.0)
Calcium, Ion: 1.25 mmol/L (ref 1.15–1.40)
HCT: 37 % — ABNORMAL LOW (ref 39.0–52.0)
Hemoglobin: 12.6 g/dL — ABNORMAL LOW (ref 13.0–17.0)
O2 Saturation: 97 %
Potassium: 4.1 mmol/L (ref 3.5–5.1)
Sodium: 142 mmol/L (ref 135–145)
TCO2: 22 mmol/L (ref 22–32)
pCO2 arterial: 37.6 mmHg (ref 32–48)
pH, Arterial: 7.349 — ABNORMAL LOW (ref 7.35–7.45)
pO2, Arterial: 96 mmHg (ref 83–108)

## 2022-07-22 LAB — POCT I-STAT EG7
Acid-base deficit: 4 mmol/L — ABNORMAL HIGH (ref 0.0–2.0)
Acid-base deficit: 4 mmol/L — ABNORMAL HIGH (ref 0.0–2.0)
Bicarbonate: 21.9 mmol/L (ref 20.0–28.0)
Bicarbonate: 22.2 mmol/L (ref 20.0–28.0)
Calcium, Ion: 1.16 mmol/L (ref 1.15–1.40)
Calcium, Ion: 1.24 mmol/L (ref 1.15–1.40)
HCT: 36 % — ABNORMAL LOW (ref 39.0–52.0)
HCT: 37 % — ABNORMAL LOW (ref 39.0–52.0)
Hemoglobin: 12.2 g/dL — ABNORMAL LOW (ref 13.0–17.0)
Hemoglobin: 12.6 g/dL — ABNORMAL LOW (ref 13.0–17.0)
O2 Saturation: 69 %
O2 Saturation: 71 %
Potassium: 3.8 mmol/L (ref 3.5–5.1)
Potassium: 4.1 mmol/L (ref 3.5–5.1)
Sodium: 144 mmol/L (ref 135–145)
Sodium: 145 mmol/L (ref 135–145)
TCO2: 23 mmol/L (ref 22–32)
TCO2: 23 mmol/L (ref 22–32)
pCO2, Ven: 41.3 mmHg — ABNORMAL LOW (ref 44–60)
pCO2, Ven: 41.9 mmHg — ABNORMAL LOW (ref 44–60)
pH, Ven: 7.333 (ref 7.25–7.43)
pH, Ven: 7.333 (ref 7.25–7.43)
pO2, Ven: 39 mmHg (ref 32–45)
pO2, Ven: 40 mmHg (ref 32–45)

## 2022-07-22 LAB — LIPOPROTEIN A (LPA): Lipoprotein (a): 198.6 nmol/L — ABNORMAL HIGH (ref <75.0)

## 2022-07-22 SURGERY — RIGHT/LEFT HEART CATH AND CORONARY ANGIOGRAPHY
Anesthesia: LOCAL

## 2022-07-22 MED ORDER — HYDRALAZINE HCL 20 MG/ML IJ SOLN: 10.0000 mg | INTRAMUSCULAR | Status: AC | PRN

## 2022-07-22 MED ORDER — SODIUM CHLORIDE 0.9 % IV SOLN: INTRAVENOUS | Status: AC

## 2022-07-22 MED ORDER — VERAPAMIL HCL 2.5 MG/ML IV SOLN
INTRAVENOUS | Status: AC
  Filled 2022-07-22: qty 2

## 2022-07-22 MED ORDER — HEPARIN (PORCINE) IN NACL 1000-0.9 UT/500ML-% IV SOLN
INTRAVENOUS | Status: AC
  Filled 2022-07-22: qty 500

## 2022-07-22 MED ORDER — MIDAZOLAM HCL 2 MG/2ML IJ SOLN
INTRAMUSCULAR | Status: AC
  Filled 2022-07-22: qty 2

## 2022-07-22 MED ORDER — ACETAMINOPHEN 325 MG PO TABS: 650.0000 mg | ORAL_TABLET | ORAL | Status: DC | PRN

## 2022-07-22 MED ORDER — LIDOCAINE HCL (PF) 1 % IJ SOLN
INTRAMUSCULAR | Status: AC
  Filled 2022-07-22: qty 30

## 2022-07-22 MED ORDER — SODIUM CHLORIDE 0.9% FLUSH: 3.0000 mL | INTRAVENOUS | Status: DC | PRN

## 2022-07-22 MED ORDER — METOPROLOL TARTRATE 25 MG PO TABS
25.0000 mg | ORAL_TABLET | Freq: Once | ORAL | Status: AC
  Administered 2022-07-22: 25 mg via ORAL
  Filled 2022-07-22: qty 1

## 2022-07-22 MED ORDER — HEPARIN SODIUM (PORCINE) 1000 UNIT/ML IJ SOLN
INTRAMUSCULAR | Status: DC | PRN
  Administered 2022-07-22: 5500 [IU] via INTRAVENOUS

## 2022-07-22 MED ORDER — METOPROLOL TARTRATE 50 MG PO TABS
75.0000 mg | ORAL_TABLET | Freq: Two times a day (BID) | ORAL | Status: DC
  Administered 2022-07-22: 75 mg via ORAL
  Filled 2022-07-22 (×2): qty 1

## 2022-07-22 MED ORDER — AMLODIPINE BESYLATE 5 MG PO TABS
5.0000 mg | ORAL_TABLET | Freq: Every day | ORAL | Status: DC
  Administered 2022-07-22: 5 mg via ORAL
  Filled 2022-07-22: qty 1

## 2022-07-22 MED ORDER — SODIUM CHLORIDE 0.9% FLUSH
3.0000 mL | Freq: Two times a day (BID) | INTRAVENOUS | Status: DC
  Administered 2022-07-22: 3 mL via INTRAVENOUS

## 2022-07-22 MED ORDER — ONDANSETRON HCL 4 MG/2ML IJ SOLN: 4.0000 mg | Freq: Four times a day (QID) | INTRAMUSCULAR | Status: DC | PRN

## 2022-07-22 MED ORDER — VERAPAMIL HCL 2.5 MG/ML IV SOLN
INTRAVENOUS | Status: DC | PRN
  Administered 2022-07-22: 10 mL via INTRA_ARTERIAL

## 2022-07-22 MED ORDER — LABETALOL HCL 5 MG/ML IV SOLN: 10.0000 mg | INTRAVENOUS | Status: AC | PRN

## 2022-07-22 MED ORDER — MIDAZOLAM HCL 2 MG/2ML IJ SOLN
INTRAMUSCULAR | Status: DC | PRN
  Administered 2022-07-22: 2 mg via INTRAVENOUS

## 2022-07-22 MED ORDER — SODIUM CHLORIDE 0.9 % IV SOLN: 250.0000 mL | INTRAVENOUS | Status: DC | PRN

## 2022-07-22 MED ORDER — HEPARIN (PORCINE) IN NACL 1000-0.9 UT/500ML-% IV SOLN
INTRAVENOUS | Status: DC | PRN
  Administered 2022-07-22 (×2): 500 mL

## 2022-07-22 MED ORDER — FENTANYL CITRATE (PF) 100 MCG/2ML IJ SOLN
INTRAMUSCULAR | Status: DC | PRN
  Administered 2022-07-22: 50 ug via INTRAVENOUS

## 2022-07-22 MED ORDER — HEPARIN SODIUM (PORCINE) 1000 UNIT/ML IJ SOLN
INTRAMUSCULAR | Status: AC
  Filled 2022-07-22: qty 10

## 2022-07-22 MED ORDER — HEPARIN SODIUM (PORCINE) 5000 UNIT/ML IJ SOLN
5000.0000 [IU] | Freq: Three times a day (TID) | INTRAMUSCULAR | Status: DC
  Administered 2022-07-22 – 2022-07-23 (×2): 5000 [IU] via SUBCUTANEOUS
  Filled 2022-07-22 (×2): qty 1

## 2022-07-22 MED ORDER — LIDOCAINE HCL (PF) 1 % IJ SOLN
INTRAMUSCULAR | Status: DC | PRN
  Administered 2022-07-22: 5 mL
  Administered 2022-07-22: 2 mL

## 2022-07-22 MED ORDER — FENTANYL CITRATE (PF) 100 MCG/2ML IJ SOLN
INTRAMUSCULAR | Status: AC
  Filled 2022-07-22: qty 2

## 2022-07-22 SURGICAL SUPPLY — 12 items
CATH BALLN WEDGE 5F 110CM (CATHETERS) IMPLANT
CATH OPTITORQUE TIG 4.0 6F (CATHETERS) IMPLANT
DEVICE RAD COMP TR BAND LRG (VASCULAR PRODUCTS) IMPLANT
GLIDESHEATH SLEND SS 6F .021 (SHEATH) IMPLANT
GUIDEWIRE INQWIRE 1.5J.035X260 (WIRE) IMPLANT
INQWIRE 1.5J .035X260CM (WIRE) ×1
KIT HEART LEFT (KITS) ×1 IMPLANT
PACK CARDIAC CATHETERIZATION (CUSTOM PROCEDURE TRAY) ×1 IMPLANT
SHEATH GLIDE SLENDER 4/5FR (SHEATH) IMPLANT
SHEATH PROBE COVER 6X72 (BAG) IMPLANT
TRANSDUCER W/STOPCOCK (MISCELLANEOUS) ×1 IMPLANT
TUBING CIL FLEX 10 FLL-RA (TUBING) ×1 IMPLANT

## 2022-07-22 NOTE — Progress Notes (Addendum)
Rounding Note    Patient Name: Angel Lyons Date of Encounter: 07/22/2022  [facility] Cardiologist: Dr. Eldridge Dace    Subjective   No acute overnight events. No recurrent SVT. No chest pain, shortness of breath, palpitations, lightheadedness, or dizziness. He is a little nervous about his [facility] today. Answered any remaining questions he had about this.  Inpatient Medications    Scheduled Meds:  aspirin EC  81 mg Oral Daily   heparin  5,000 Units Subcutaneous Q8H   metoprolol tartrate  50 mg Oral BID   sodium chloride flush  3 mL Intravenous Q12H   Continuous Infusions:  sodium chloride     sodium chloride 1 mL/kg/hr (07/22/22 0520)   PRN Meds: sodium chloride, acetaminophen, ALPRAZolam, nitroGLYCERIN, ondansetron (ZOFRAN) IV, sodium chloride flush, zolpidem   Vital Signs    Vitals:   07/21/22 1613 07/21/22 1947 07/22/22 0412 07/22/22 0426  BP: (!) 135/96 (!) 128/90  (!) 141/93  Pulse: 71     Resp: 20 18  16   Temp: 98.3 F (36.8 C) 98.2 F (36.8 C)  98.3 F (36.8 C)  TempSrc: Oral Oral  Oral  SpO2: 94% 98% 98% 94%  Weight:   116.9 kg   Height:   6' (1.829 m)    No intake or output data in the 24 hours ending 07/22/22 0735    07/22/2022    4:12 AM 07/21/2022   10:28 AM 07/20/2022    9:47 AM  Last 3 Weights  Weight (lbs) 257 lb 11.2 oz 240 lb 220 lb  Weight (kg) 116.892 kg 108.863 kg 99.791 kg      Telemetry    Normal sinus rhythm with rates in the 70s to 90s. No recurrent SVT. He did have a run of bigeminy PVCs. - Personally Reviewed  ECG    Normal sinus rhythm, rate 77 bpm, with no acute ST/ T changes.  - Personally Reviewed  Physical Exam   GEN: African-American male resting comfortably in no acute distress.  Neck: No JVD Cardiac: RRR. No significant murmur appreciated. Respiratory: Clear to auscultation bilaterally. No wheezes, rhonchi, or rales. GI: Soft, non-distended, and non-tender. MS: No lower extremity edema. No  deformity. Skin: Warm and dry. Neuro:  No focal deficits. Psych: Normal affect   Labs    High Sensitivity Troponin:   Recent Labs  Lab 07/20/22 0847 07/20/22 1124  TROPONINIHS 10 36*     Chemistry Recent Labs  Lab 07/20/22 0847 07/21/22 0855  NA 136 139  K 3.9 4.1  CL 108 105  CO2 19* 24  GLUCOSE 254* 124*  BUN 21 18  CREATININE 1.85* 1.53*  CALCIUM 8.5* 9.4  MG 1.8  --   PROT 6.3* 6.9  ALBUMIN 3.2* 3.8  AST 89* 51*  ALT 82* 64*  ALKPHOS 58 54  BILITOT 0.6 1.3*  GFRNONAA 40* 51*  ANIONGAP 9 10    Lipids  Recent Labs  Lab 07/21/22 0515  CHOL 170  TRIG 62  HDL 36*  LDLCALC 122*  CHOLHDL 4.7    Hematology Recent Labs  Lab 07/20/22 0847 07/21/22 0855  WBC 10.6* 9.6  RBC 5.02 4.85  HGB 14.9 14.4  HCT 44.5 43.3  MCV 88.6 89.3  MCH 29.7 29.7  MCHC 33.5 33.3  RDW 12.4 12.7  PLT 256 245   Thyroid  Recent Labs  Lab 07/20/22 0847  TSH 2.380    BNP Recent Labs  Lab 07/20/22 0850  BNP 114.4*    DDimer  No results for input(s): "DDIMER" in the last 168 hours.   Radiology    ECHO TEE  Result Date: 07/21/2022    TRANSESOPHOGEAL ECHO REPORT   Patient Name:   Angel Lyons Date of Exam: 07/21/2022 Medical Rec #:  [id number]        Height:       76.0 in Accession #:    [id number]       Weight:       220.0 lb Date of Birth:  1960-07-26        BSA:          2.307 m Patient Age:    61 years         BP:           100/65 mmHg Patient Gender: M                HR:           82 bpm. Exam Location:  Inpatient Procedure: Transesophageal Echo, 3D Echo, Color Doppler and Cardiac Doppler Indications:     Mitral Regurgitation  History:         Patient has prior history of Echocardiogram examinations, most                  recent 07/20/2022. Signs/Symptoms:Shortness of Breath; Risk                  Factors:Sleep Apnea and Hypertension.  Sonographer:     Eulah Pont RDCS Referring Phys:  Dietrich Pates, V Diagnosing Phys: Dietrich Pates MD PROCEDURE: After discussion of the  risks and benefits of a TEE, an informed consent was obtained from the patient. The transesophogeal probe was passed without difficulty through the esophogus of the patient. Local oropharyngeal anesthetic was provided with viscous lidocaine. Sedation performed by different physician. The patient was monitored while under deep sedation. Anesthestetic sedation was provided intravenously by Anesthesiology: 538.22mg  of Propofol. The patient's vital signs; including heart rate, blood pressure, and oxygen saturation; remained stable throughout the procedure. The patient developed no complications during the procedure.  IMPRESSIONS  1. The left ventricle has low normal function.  2. Right ventricular systolic function is mildly reduced. The right ventricular size is normal.  3. No left atrial/left atrial appendage thrombus was detected.  4. Prolapes of teh P2 segment of the posterior mitral leaflet with noncoaptation in this region. MR is directed more anterior into LA with some splaying of the regurgitant signal.. There is a small amount of back flow into R upper pulmonary vein consistent with severe MR (at SBP of 93 mm Hg).  5. Tricuspid valve regurgitation is mild to moderate.  6. The aortic valve is tricuspid. Aortic valve regurgitation is trivial. Aortic valve sclerosis is present, with no evidence of aortic valve stenosis. FINDINGS  Left Ventricle: The left ventricle has low normal function. The left ventricular internal cavity size was normal in size. Right Ventricle: The right ventricular size is normal. Right ventricular systolic function is mildly reduced. Left Atrium: Left atrial size was normal in size. No left atrial/left atrial appendage thrombus was detected. Pericardium: There is no evidence of pericardial effusion. Mitral Valve: Prolapes of teh P2 segment of the posterior mitral leaflet with noncoaptation in this region. MR is directed more anterior into LA with some splaying of the regurgitant signal..  There is a small amount of back flow into R upper pulmonary vein consistent with severe MR (at SBP of 93 mm Hg). Tricuspid Valve: The  tricuspid valve is normal in structure. Tricuspid valve regurgitation is mild to moderate. Aortic Valve: The aortic valve is tricuspid. Aortic valve regurgitation is trivial. Aortic valve sclerosis is present, with no evidence of aortic valve stenosis. Pulmonic Valve: The pulmonic valve was grossly normal. Pulmonic valve regurgitation is trivial. Aorta: The aortic root is normal in size and structure. There is minimal (Grade I) plaque. Additional Comments: Spectral Doppler performed. MR Peak grad: 111.1 mmHg MR Mean grad: 77.0 mmHg MR Vmax:      527.00 cm/s MR Vmean:     424.0 cm/s Dietrich Pates MD Electronically signed by Dietrich Pates MD Signature Date/Time: 07/21/2022/5:37:05 PM    Final    ECHOCARDIOGRAM COMPLETE  Result Date: 07/20/2022    ECHOCARDIOGRAM REPORT   Patient Name:   Angel Lyons Date of Exam: 07/20/2022 Medical Rec #:  [id number]        Height:       76.0 in Accession #:    [id number]       Weight:       220.0 lb Date of Birth:  [birth year]        BSA:          2.307 m Patient Age:    61 years         BP:           177/113 mmHg Patient Gender: M                HR:           99 bpm. Exam Location:  Inpatient Procedure: 2D Echo, Color Doppler and Cardiac Doppler Indications:    R07.9* Chest pain, unspecified  History:        Patient has no prior history of Echocardiogram examinations.                 Risk Factors:Hypertension and Sleep Apnea.  Sonographer:    Irving Burton Senior RDCS Referring Phys: Arty Baumgartner IMPRESSIONS  1. Prolapse of posterior MV leaflet best seen on apical 4 chamber view with moderate to severe MR; suggest TEE to further assess.  2. Left ventricular ejection fraction, by estimation, is 40 to 45%. The left ventricle has mildly decreased function. The left ventricle demonstrates global hypokinesis. There is moderate left ventricular hypertrophy. Left  ventricular diastolic parameters are consistent with Grade II diastolic dysfunction (pseudonormalization).  3. Right ventricular systolic function is moderately reduced. The right ventricular size is moderately enlarged. There is mildly elevated pulmonary artery systolic pressure.  4. Left atrial size was moderately dilated.  5. Right atrial size was moderately dilated.  6. The mitral valve is normal in structure. Moderate to severe mitral valve regurgitation. No evidence of mitral stenosis. 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  7. The aortic valve is tricuspid. Aortic valve regurgitation is not visualized. Aortic valve sclerosis/calcification is present, without any evidence of aortic stenosis.  8. The inferior vena cava is dilated in size with <50% respiratory variability, suggesting right atrial pressure of 15 mmHg. Comparison(s): No prior Echocardiogram. FINDINGS  Left Ventricle: Left ventricular ejection fraction, by estimation, is 40 to 45%. The left ventricle has mildly decreased function. The left ventricle demonstrates global hypokinesis. The left ventricular internal cavity size was normal in size. There is  moderate left ventricular hypertrophy. Left ventricular diastolic parameters are consistent with Grade II diastolic dysfunction (pseudonormalization). Right Ventricle: The right ventricular size is moderately enlarged. Right ventricular systolic function is moderately reduced.  There is mildly elevated pulmonary artery systolic pressure. The tricuspid regurgitant velocity is 2.54 m/s, and with an assumed right atrial pressure of 15 mmHg, the estimated right ventricular systolic pressure is 40.8 mmHg. Left Atrium: Left atrial size was moderately dilated. Right Atrium: Right atrial size was moderately dilated. Pericardium: There is no evidence of pericardial effusion. Mitral Valve: The mitral valve is normal in structure. There is severe holosystolic prolapse of  posterior leaflet of the mitral valve. Moderate to severe mitral valve regurgitation. No evidence of mitral valve stenosis. Tricuspid Valve: The tricuspid valve is normal in structure. Tricuspid valve regurgitation is mild . No evidence of tricuspid stenosis. Aortic Valve: The aortic valve is tricuspid. Aortic valve regurgitation is not visualized. Aortic valve sclerosis/calcification is present, without any evidence of aortic stenosis. Pulmonic Valve: The pulmonic valve was normal in structure. Pulmonic valve regurgitation is not visualized. No evidence of pulmonic stenosis. Aorta: The aortic root is normal in size and structure. Venous: The inferior vena cava is dilated in size with less than 50% respiratory variability, suggesting right atrial pressure of 15 mmHg. IAS/Shunts: No atrial level shunt detected by color flow Doppler. Additional Comments: Prolapse of posterior MV leaflet best seen on apical 4 chamber view with moderate to severe MR; suggest TEE to further assess.  LEFT VENTRICLE PLAX 2D LVIDd:         5.60 cm      Diastology LVIDs:         4.60 cm      LV e' medial:    5.77 cm/s LV PW:         0.90 cm      LV E/e' medial:  11.5 LV IVS:        0.90 cm      LV e' lateral:   9.36 cm/s LVOT diam:     2.30 cm      LV E/e' lateral: 7.1 LV SV:         51 LV SV Index:   22 LVOT Area:     4.15 cm  LV Volumes (MOD) LV vol d, MOD A2C: 158.0 ml LV vol d, MOD A4C: 161.0 ml LV vol s, MOD A2C: 93.8 ml LV vol s, MOD A4C: 104.0 ml LV SV MOD A2C:     64.2 ml LV SV MOD A4C:     161.0 ml LV SV MOD BP:      59.9 ml RIGHT VENTRICLE RV S prime:     9.14 cm/s TAPSE (M-mode): 1.3 cm LEFT ATRIUM             Index        RIGHT ATRIUM           Index LA diam:        3.70 cm 1.60 cm/m   RA Area:     27.50 cm LA Vol (A2C):   95.6 ml 41.44 ml/m  RA Volume:   104.00 ml 45.08 ml/m LA Vol (A4C):   82.4 ml 35.72 ml/m LA Biplane Vol: 95.4 ml 41.36 ml/m  AORTIC VALVE LVOT Vmax:   73.10 cm/s LVOT Vmean:  59.200 cm/s LVOT VTI:     0.123 m  AORTA Ao Root diam: 3.30 cm Ao Asc diam:  3.70 cm MITRAL VALVE                  TRICUSPID VALVE MV Area (PHT): 1.85 cm       TR Peak grad:   25.8 mmHg MV  Decel Time: 410 msec       TR Vmax:        254.00 cm/s MR Peak grad:    109.0 mmHg MR Mean grad:    65.0 mmHg    SHUNTS MR Vmax:         522.00 cm/s  Systemic VTI:  0.12 m MR Vmean:        378.0 cm/s   Systemic Diam: 2.30 cm MR PISA:         6.28 cm MR PISA Eff ROA: 38 mm MR PISA Radius:  1.00 cm MV E velocity: 66.60 cm/s MV A velocity: 31.90 cm/s MV E/A ratio:  2.09 Kirk Ruths MD Electronically signed by Kirk Ruths MD Signature Date/Time: 07/20/2022/3:49:06 PM    Final    DG Chest Portable 1 View  Result Date: 07/20/2022 CLINICAL DATA:  SVT.  Shortness of breath and cough EXAM: PORTABLE CHEST 1 VIEW COMPARISON:  7/31/7 FINDINGS: Cardiac enlargement. There is no pleural effusion or edema. No airspace opacities identified. Review of the visualized osseous structures is unremarkable. IMPRESSION: No acute cardiopulmonary abnormalities. Cardiac enlargement Electronically Signed   By: Kerby Moors M.D.   On: 07/20/2022 09:13    Cardiac Studies   TTE 07/20/2022: 1. Prolapse of posterior MV leaflet best seen on apical 4 chamber view  with moderate to severe MR; suggest TEE to further assess.   2. Left ventricular ejection fraction, by estimation, is 40 to 45%. The  left ventricle has mildly decreased function. The left ventricle  demonstrates global hypokinesis. There is moderate left ventricular  hypertrophy. Left ventricular diastolic  parameters are consistent with Grade II diastolic dysfunction  (pseudonormalization).   3. Right ventricular systolic function is moderately reduced. The right  ventricular size is moderately enlarged. There is mildly elevated  pulmonary artery systolic pressure.   4. Left atrial size was moderately dilated.   5. Right atrial size was moderately dilated.   6. The mitral valve is normal in  structure. Moderate to severe mitral  valve regurgitation. No evidence of mitral stenosis. 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   7. The aortic valve is tricuspid. Aortic valve regurgitation is not  visualized. Aortic valve sclerosis/calcification is present, without any  evidence of aortic stenosis.   8. The inferior vena cava is dilated in size with <50% respiratory  variability, suggesting right atrial pressure of 15 mmHg.  _______________  TEE 07/21/2022: Impressions:  1. The left ventricle has low normal function.   2. Right ventricular systolic function is mildly reduced. The right  ventricular size is normal.   3. No left atrial/left atrial appendage thrombus was detected.   4. Prolapes of teh P2 segment of the posterior mitral leaflet with  noncoaptation in this region. MR is directed more anterior into LA with  some splaying of the regurgitant signal.. There is a small amount of back  flow into R upper pulmonary vein  consistent with severe MR (at SBP of 93 mm Hg).   5. Tricuspid valve regurgitation is mild to moderate.   6. The aortic valve is tricuspid. Aortic valve regurgitation is trivial.  Aortic valve sclerosis is present, with no evidence of aortic valve  stenosis.   Patient Profile     62 y.o. male with a history of hypertension and obstructive sleep apnea but no known heart disease prior to admission. He was admitted on 07/20/2022 for SVT with heart rates in the 240s after presenting  with progressive shortness of breath. He did not respond wot Adenosine or vagal maneuvers and was ultimately cardioverted in the ED with return of sinus rhythm. He also reported exertional chest tightness and shortness of breath over the past several months. Work-up also revealed severe MR which is being worked up.  Assessment & Plan    SVT Presented in SVT with rates in the 240s. He did not respond to Adenosine or vagal maneuvers and was  successful cardioverted in the ED. Electrolytes OK. Echo mildly reduced EF of 40-45% with global hypokinesis. EP was consulted due to concern for possible 1:1 atrial flutter and have recommended EP study and ablation in the future. They recommending avoiding Amiodarone so not to interfere with the EP study. - No recurrent SVT. He did have a run of bigeminy PVCs this morning. Asymptomatic with this. - Currently on Lopressor 50mg  twice daily. Will increase to 75 twice daily. Can transition to Toprol-XL at home given reduced EF.  Chest Pain Patient also described exertion chest tightness and dyspnea over the past several months while at the gym which is relieved with rest. High-sensitivity troponin 10 >> 36 but this is in the setting of SVT with heart rates >200 bpm and consistent with demand ischemia. Echo showed LVEF of 40-45% with global hypokinesis as well as prolapse of posterior MV leaflet with moderate to severe MR. - No chest pain or shortness of breath. - Question whether exertional tightness and dyspnea could be due to his arrhythmias.  - Given severe MR, plan is for [facility] today so we will evaluate coronaries at that time.  Mitral Valve Prolapse Severe Mitral Regurgitation TTE showed prolapse of posterior MV leaflet and moderate to severe MR. 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 MR is directed more anterior into the left atrium with some splaying of the regurgitant signal and there is a small amount of back flow into the right upper pulmonary vein consistent with severe MR. - Plan is for [facility] today.  New Cardiomyopathy Echo this admission showed LVEF of 40-45% with global hypokinesis. May be secondary to tachyarrhythmia.  - Euvolemic on exam. - Continue beta-blocker as above.  - Can continue to adjust GDMT after cath.   Hypertension Patient reports previously being on antihypertensive but BP improved so he has not  been on anything recently.  - BP remains elevated this morning.  - Will increase Lopressor to 75mg  twice daily. - Will add Amlodipine 5mg  daily.  Dyslipidemia Lipid panel this admission: Total Cholesterol 170, Triglycerides 62, HDL 36, LDL 122.  - No currently on a statin. - Will wait for cath results today. If he has any CAD, LDL goal will be <70 and will recommend starting a statin. Although will need to be mindful of elevated LFTs.  Pre-Diabetes Hemoglobin A1c 6.2% this admission.  Elevated LFTs AST and ALT elevated at 89 and 82 respectively on admission. Improved to 51 and 64 respectively on repeat labs on 1/18 but Total Bili slightly elevated at 1.3. He denies any history of ETOH abuse. - Will repeat LFTs today.   CKD vs AKI Creatinine 1.85. Unclear baseline. He was given 1 L of normal saline in the ED and BMET improved to 1.53.  - Today's labs pending.   For questions or updates, please contact [facility] Please consult [email or web address] for contact info under        Signed, , PA-C  07/22/2022, 7:35  AM     I have examined the patient and reviewed assessment and plan and discussed with patient.  Agree with above as stated.    Cr improved. I personally reviewed the cath.  No significant CAD.  V waves noted on RHC.  Will get surgical consult to determine if minithoracotomy with MV repair and Maze, LAA clipping would be a possibility.    Need to work on BP control.  Amlodipine added today.  Beta blocker started as well which will hopefully help with AFlutter.  Hopeful for discharge tomorrow if he is stable.   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   Larae Grooms

## 2022-07-22 NOTE — Progress Notes (Signed)
Heart Failure Nurse Navigator Progress Note  PCP: Foye Spurling, MD (Inactive) PCP-Cardiologist: None Admission Diagnosis: SVT, shortness of breath, Diaphoresis, chest pain Admitted from: Work via EMS  Presentation:   Angel Lyons presented with shortness of breath , diaphoresis while at work, with EMS  was in SVT with rate of 246, given Adenosine 6 mg IV.unsuccessful ,BP 133/100, BNP 114.4, Troponin 36, Adenosine given again in ED without success, with sedation and cardioversion electrically was successful.   Patient was educated on the sign and symptoms of heart failure, daily weights, when to call his doctor or go to the ED, diet/ fluid restrictions, taking all medications as prescribed and attending all medical appointments, patient verbalized his understanding, a HF TOC appointment was scheduled for 08/01/2022 @ 9 am.     ECHO/ LVEF: 40-45 %  Clinical Course:  Past Medical History:  Diagnosis Date   Abnormal heart rhythm    HTN (hypertension)    Sleep apnea      Social History   Socioeconomic History   Marital status: Single    Spouse name: Not on file   Number of children: 4   Years of education: Not on file   Highest education level: Associate degree: academic program  Occupational History   Occupation: Proofreader  Tobacco Use   Smoking status: Never   Smokeless tobacco: Never  Vaping Use   Vaping Use: Never used  Substance and Sexual Activity   Alcohol use: No   Drug use: No   Sexual activity: Not on file  Other Topics Concern   Not on file  Social History Narrative   Not on file   Social Determinants of Health   Financial Resource Strain: Low Risk  (07/22/2022)   Overall Financial Resource Strain (CARDIA)    Difficulty of Paying Living Expenses: Not hard at all  Food Insecurity: No Food Insecurity (07/22/2022)   Hunger Vital Sign    Worried About Running Out of Food in the Last Year: Never true    [place] in the Last Year: Never  true  Transportation Needs: No Transportation Needs (07/22/2022)   PRAPARE - Hydrologist (Medical): No    Lack of Transportation (Non-Medical): No  Physical Activity: Not on file  Stress: Not on file  Social Connections: Not on file   Education Assessment and Provision:  Detailed education and instructions provided on heart failure disease management including the following:  Signs and symptoms of Heart Failure When to call the physician Importance of daily weights Low sodium diet Fluid restriction Medication management Anticipated future follow-up appointments  Patient education given on each of the above topics.  Patient acknowledges understanding via teach back method and acceptance of all instructions.  Education Materials:  "Living Better With Heart Failure" Booklet, HF zone tool, & Daily Weight Tracker Tool.  Patient has scale at home: yes Patient has pill box at home: NA    High Risk Criteria for Readmission and/or Poor Patient Outcomes: Heart failure hospital admissions (last 6 months): 0  No Show rate: 20% Difficult social situation: No Demonstrates medication adherence: Yes Primary Language: English Literacy level: Reading, writing, and comprehension  Barriers of Care:   New HF  Diet/ fluids/ daily weights  Considerations/Referrals:   Referral made to Heart Failure Pharmacist Stewardship: Yes Referral made to Heart Failure CSW/NCM TOC: No Referral made to Heart & Vascular TOC clinic: Yes, 08/01/2022  Items for Follow-up on DC/TOC: Continued HF education  Diet/ fluids/ daily weights   Earnestine Leys, BSN, RN Heart Failure Leisure centre manager Chat Only

## 2022-07-22 NOTE — Brief Op Note (Signed)
   BRIEF RIGHT& LEFT CARDIAC CATH REPORT  07/22/2022 10:32 AM.  [facility] Cardiologist Dr. Irish Lack  PROCEDURE:  Procedure(s): RIGHT/LEFT HEART CATH AND CORONARY ANGIOGRAPHY (N/A)  SURGEON:  Surgeon(s) and Role:  Leonie Man, MD - Primary  PATIENT:  Angel Lyons is a  62 y.o. male  with a hx of HTN and OSA who was seen by Dr. Irish Lack 07/20/2022 for the evaluation of chest pain/SVT at the request of Dr. Sherry Ruffing.  He had minimal troponin elevation, but echocardiogram showed moderate to severe/severe MR with MVP.  He underwent TEE on 1/18 confirming moderate to severe closer to severe MR.  He now presents for right left heart cath for potential preop evaluation prior to atrial flutter ablation as well as MVR.    PRE-OPERATIVE DIAGNOSIS:  hf / mitral regurgitation, atrial flutter  POST-OPERATIVE DIAGNOSIS:   Angiographically minimal Mild pulm hypertension with PAP-mean 2 49/9 -9 mmHg.  PCWP 19 mmHg with a large V wave to 33 mmHg LVEDP 17 mmHg.   PROCEDURE Performed Time Out: Verified patient identification, verified procedure, site/side was marked, verified correct patient position, special equipment/implants available, medications/allergies/relevent history reviewed, required imaging and test results available. Performed.  Access:  RIGHT Radial Artery: 6 Fr sheath -- Seldinger technique using Micropuncture Kit Direct ultrasound guidance used.  Permanent image obtained and placed on chart. 10 mL radial cocktail IA; 5500 Units IV Heparin * Right Brachial/Antecubital Vein: The existing 18-gauge IV was exchanged over a wire for a 5Fr sheath  Right Heart Catheterization: 5 Fr Gordy Councilman catheter advanced under fluoroscopy with balloon inflated to the RA, RV, then PCWP-PA for hemodynamic measurement. * Simultaneous FA & PA blood gases checked for SaO2% to calculate FICK CO/CI.  The Catheter removed completely out of the body with balloon deflated.  Left Heart  Catheterization: 5Fr TIG 4.0 Catheter was advanced or exchanged over a J-wire under direct fluoroscopic guidance into the ascending aorta; the catheter was advanced across the aorta valve first for management of hemodynamics.  The catheter was then pulled back across aortic valve pullback gradient.  It was then used to engage first the left and the right coronary artery for selective coronary cineangiography.   Upon completion of Angiogaphy, the catheter was removed completely out of the body over a wire, without complication.  Brachial Sheath(s) removed in the Cath Lab with manual pressure for hemostasis.    Radial sheath removed in the Cardiac Catheterization lab with TR Band placed for hemostasis.  TR Band: 1015 Hours; 15 mL air  MEDICATIONS * SQ Lidocaine 75mL * Radial Cocktail: 3 mg Verapmil in 10 mL NS * Isovue Contrast: 60 * Heparin: 5500 Units  EBL:  < 50 mL     DICTATION: .Note written in EPIC  PLAN OF CARE:  Return to RN unit for ongoing care -> consider CVTS consult for MVR/MAZE & EP for Flutter ablation  PATIENT DISPOSITION:  PACU - hemodynamically stable.   Delay start of Pharmacological VTE agent (>24hrs) due to surgical blood loss or risk of bleeding: not applicable     Glenetta Hew, MD

## 2022-07-22 NOTE — TOC Benefit Eligibility Note (Signed)
Patient Teacher, English as a foreign language completed.    The patient is currently admitted and upon discharge could be taking Entresto 24-26mg .  The current 30 day co-pay is $40.00.   The patient is currently admitted and upon discharge could be taking Farxiga 10 mg.  The current 30 day co-pay is $40.00.   The patient is currently admitted and upon discharge could be taking Jardiance 10 mg.  The current 30 day co-pay is $40.00.   The patient is insured through [place], [place] Patient Advocate Specialist [place] Patient Advocate Team Direct Number: [phone]  Fax: [phone]

## 2022-07-22 NOTE — Interval H&P Note (Signed)
History and Physical Interval Note:  07/22/2022 9:24 AM  07/21/2022:  Patient with moderate to severe mitral regurgitation and prolapse of posterior MV leaflet on TTE. Underwent TEE today with official report pending. Will need [facility] cath to further evaluate coronary arteries. On schedule tomorrow for 9am. NPO at midnight. Orders placed.    Shared Decision Making/Informed Consent{ The risks [stroke (1 in 1000), death (1 in 1000), kidney failure [usually temporary] (1 in 500), bleeding (1 in 200), allergic reaction [possibly serious] (1 in 200)], benefits (diagnostic support and management of coronary artery disease) and alternatives of a cardiac catheterization were discussed in detail with Mr. Dougher and he is willing to proceed.   SignedReino Bellis, NP-C 07/21/2022, 2:28 PM Pager: [phone]    REEVE TURNLEY  has presented today for surgery, with the diagnosis of hf.  The various methods of treatment have been discussed with the patient and family. After consideration of risks, benefits and other options for treatment, the patient has consented to  Procedure(s): RIGHT/LEFT HEART CATH AND CORONARY ANGIOGRAPHY (N/A) as a surgical intervention.  The patient's history has been reviewed, patient examined, no change in status, stable for surgery.  I have reviewed the patient's chart and labs.  Questions were answered to the patient's satisfaction.     Glenetta Hew

## 2022-07-22 NOTE — Consult Note (Signed)
[address]       [place] [address]             [phone]        Angel Lyons [facility] Medical Record #[id number] Date of Birth: 1960/07/19  Referring: Eldridge Dace Primary Care: Laurena Slimmer, MD (Inactive) Primary Cardiologist:None  Chief Complaint:    Chief Complaint  Patient presents with   Shortness of Breath   History of Present Illness:      Angel Lyons is a 62 yo male with history of HTN and OSA.  The patient presented to the ED with complaints of tachycardia, chest pain, and shortness of breath.  The patient has been experiencing exertional chest pain and shortness of breath for the past several weeks.  He had been scheduled for cardiology evaluation on outpatient basis.  However upon awakening on 1/17 the patient developed increased chest pressure and fast heart rate with shortness of breath.  This was accompanied by lightheadedness and diaphoresis.  He denied nausea and vomiting.  He was brought the ED via EMS where EKG revealed elevated HR of 250.  Cardiology reviewed and felt this was likely SVT (1:1 aflutter) and he was treated with Adenosine without change in rhythm.  The patient was instructed to perform vagal maneuver which also had no effect. Following cardioversion he has maintained NSR. Has been on BB. He underwent TTE and TEE which has revealed depressed LV function 45% by TTE low normal by TEE, depressed RV function with moderate TR, and severe MR with prolapse of P2 segment of posterior leaflet. Cath without evidence of CAD and with moderate PHTN. Pt has had elevated CR that has been normalizing however unknown baseline    Past Medical History:  Diagnosis Date   Abnormal heart rhythm    HTN (hypertension)    Sleep apnea     Past Surgical History:  Procedure Laterality Date   NO PAST SURGERIES      Social History   Tobacco Use  Smoking Status Never  Smokeless Tobacco Never    Social History   Substance and Sexual  Activity  Alcohol Use No     No Known Allergies  Current Facility-Administered Medications  Medication Dose Route Frequency Provider Last Rate Last Admin   0.9 %  sodium chloride infusion   Intravenous Continuous Marykay Lex, MD 100 mL/hr at 07/22/22 1034 New Bag at 07/22/22 1034   0.9 %  sodium chloride infusion  250 mL Intravenous PRN Marykay Lex, MD       acetaminophen (TYLENOL) tablet 650 mg  650 mg Oral Q4H PRN Pricilla Riffle, MD       ALPRAZolam Prudy Feeler) tablet 0.25 mg  0.25 mg Oral BID PRN Pricilla Riffle, MD       amLODipine (NORVASC) tablet 5 mg  5 mg Oral Daily Marjie Skiff E, PA-C   5 mg at 07/22/22 9628   aspirin EC tablet 81 mg  81 mg Oral Daily Pricilla Riffle, MD   81 mg at 07/22/22 0545   heparin injection 5,000 Units  5,000 Units Subcutaneous Q8H Marykay Lex, MD       hydrALAZINE (APRESOLINE) injection 10 mg  10 mg Intravenous Q20 Min PRN Marykay Lex, MD       labetalol (NORMODYNE) injection 10 mg  10 mg Intravenous Q10 min PRN Marykay Lex, MD       metoprolol tartrate (LOPRESSOR) tablet 75 mg  75 mg Oral BID Darreld Mclean, PA-C       nitroGLYCERIN (NITROSTAT) SL tablet 0.4 mg  0.4 mg Sublingual Q5 Min x 3 PRN Fay Records, MD       ondansetron [facility]) injection 4 mg  4 mg Intravenous Q6H PRN Fay Records, MD       sodium chloride flush (NS) 0.9 % injection 3 mL  3 mL Intravenous Q12H Reino Bellis B, NP   3 mL at 07/21/22 2236   sodium chloride flush (NS) 0.9 % injection 3 mL  3 mL Intravenous Q12H Leonie Man, MD       sodium chloride flush (NS) 0.9 % injection 3 mL  3 mL Intravenous PRN Leonie Man, MD       zolpidem (AMBIEN) tablet 5 mg  5 mg Oral QHS PRN,MR X 1 Fay Records, MD        No medications prior to admission.    Family History  Problem Relation Age of Onset   Hypertension Father         Physical Exam: BP (!) 140/98   Pulse 97   Temp 97.6 F (36.4 C) (Oral)   Resp 18   Ht 6' (1.829 m)   Wt 116.9  kg   SpO2 99%   BMI 34.95 kg/m  EXAM Teeth in relatively good repair Neuro: intact Lungs: clear Card: rr with 2/6 sem Abd: obese Ext: warm and dry  Diagnostic Studies & Laboratory data:     Recent Radiology Findings:   ECHO TEE  Result Date: 07/21/2022    TRANSESOPHOGEAL ECHO REPORT   Patient Name:   Angel Lyons Date of Exam: 07/21/2022 Medical Rec #:  [id number]        Height:       76.0 in Accession #:    [id number]       Weight:       220.0 lb Date of Birth:  [birth year]        BSA:          2.307 m Patient Age:    19 years         BP:           100/65 mmHg Patient Gender: M                HR:           82 bpm. Exam Location:  Inpatient Procedure: Transesophageal Echo, 3D Echo, Color Doppler and Cardiac Doppler Indications:     Mitral Regurgitation  History:         Patient has prior history of Echocardiogram examinations, most                  recent 07/20/2022. Signs/Symptoms:Shortness of Breath; Risk                  Factors:Sleep Apnea and Hypertension.  Sonographer:     Bernadene Person RDCS Referring Phys:  Dorris Carnes, V Diagnosing Phys: Dorris Carnes MD PROCEDURE: After discussion of the risks and benefits of a TEE, an informed consent was obtained from the patient. The transesophogeal probe was passed without difficulty through the esophogus of the patient. Local oropharyngeal anesthetic was provided with viscous lidocaine. Sedation performed by different physician. The patient was monitored while under deep sedation. Anesthestetic sedation was provided intravenously by Anesthesiology: 538.22mg  of Propofol. The patient's vital signs; including heart rate, blood pressure, and oxygen saturation; remained stable throughout the procedure. The  patient developed no complications during the procedure.  IMPRESSIONS  1. The left ventricle has low normal function.  2. Right ventricular systolic function is mildly reduced. The right ventricular size is normal.  3. No left atrial/left atrial appendage  thrombus was detected.  4. Prolapes of teh P2 segment of the posterior mitral leaflet with noncoaptation in this region. MR is directed more anterior into LA with some splaying of the regurgitant signal.. There is a small amount of back flow into R upper pulmonary vein consistent with severe MR (at SBP of 93 mm Hg).  5. Tricuspid valve regurgitation is mild to moderate.  6. The aortic valve is tricuspid. Aortic valve regurgitation is trivial. Aortic valve sclerosis is present, with no evidence of aortic valve stenosis. FINDINGS  Left Ventricle: The left ventricle has low normal function. The left ventricular internal cavity size was normal in size. Right Ventricle: The right ventricular size is normal. Right ventricular systolic function is mildly reduced. Left Atrium: Left atrial size was normal in size. No left atrial/left atrial appendage thrombus was detected. Pericardium: There is no evidence of pericardial effusion. Mitral Valve: Prolapes of teh P2 segment of the posterior mitral leaflet with noncoaptation in this region. MR is directed more anterior into LA with some splaying of the regurgitant signal.. There is a small amount of back flow into R upper pulmonary vein consistent with severe MR (at SBP of 93 mm Hg). Tricuspid Valve: The tricuspid valve is normal in structure. Tricuspid valve regurgitation is mild to moderate. Aortic Valve: The aortic valve is tricuspid. Aortic valve regurgitation is trivial. Aortic valve sclerosis is present, with no evidence of aortic valve stenosis. Pulmonic Valve: The pulmonic valve was grossly normal. Pulmonic valve regurgitation is trivial. Aorta: The aortic root is normal in size and structure. There is minimal (Grade I) plaque. Additional Comments: Spectral Doppler performed. MR Peak grad: 111.1 mmHg MR Mean grad: 77.0 mmHg MR Vmax:      527.00 cm/s MR Vmean:     424.0 cm/s Dietrich Pates MD Electronically signed by Dietrich Pates MD Signature Date/Time: 07/21/2022/5:37:05 PM     Final    ECHOCARDIOGRAM COMPLETE  Result Date: 07/20/2022    ECHOCARDIOGRAM REPORT   Patient Name:   Angel Lyons Date of Exam: 07/20/2022 Medical Rec #:  [id number]        Height:       76.0 in Accession #:    [id number]       Weight:       220.0 lb Date of Birth:  01-Dec-1960        BSA:          2.307 m Patient Age:    61 years         BP:           177/113 mmHg Patient Gender: M                HR:           99 bpm. Exam Location:  Inpatient Procedure: 2D Echo, Color Doppler and Cardiac Doppler Indications:    R07.9* Chest pain, unspecified  History:        Patient has no prior history of Echocardiogram examinations.                 Risk Factors:Hypertension and Sleep Apnea.  Sonographer:    Irving Burton Senior RDCS Referring Phys: Arty Baumgartner IMPRESSIONS  1. Prolapse of posterior MV leaflet best seen on apical  4 chamber view with moderate to severe MR; suggest TEE to further assess.  2. Left ventricular ejection fraction, by estimation, is 40 to 45%. The left ventricle has mildly decreased function. The left ventricle demonstrates global hypokinesis. There is moderate left ventricular hypertrophy. Left ventricular diastolic parameters are consistent with Grade II diastolic dysfunction (pseudonormalization).  3. Right ventricular systolic function is moderately reduced. The right ventricular size is moderately enlarged. There is mildly elevated pulmonary artery systolic pressure.  4. Left atrial size was moderately dilated.  5. Right atrial size was moderately dilated.  6. The mitral valve is normal in structure. Moderate to severe mitral valve regurgitation. No evidence of mitral stenosis. 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  7. The aortic valve is tricuspid. Aortic valve regurgitation is not visualized. Aortic valve sclerosis/calcification is present, without any evidence of aortic stenosis.  8. The inferior vena cava is dilated in size with <50% respiratory  variability, suggesting right atrial pressure of 15 mmHg. Comparison(s): No prior Echocardiogram. FINDINGS  Left Ventricle: Left ventricular ejection fraction, by estimation, is 40 to 45%. The left ventricle has mildly decreased function. The left ventricle demonstrates global hypokinesis. The left ventricular internal cavity size was normal in size. There is  moderate left ventricular hypertrophy. Left ventricular diastolic parameters are consistent with Grade II diastolic dysfunction (pseudonormalization). Right Ventricle: The right ventricular size is moderately enlarged. Right ventricular systolic function is moderately reduced. There is mildly elevated pulmonary artery systolic pressure. The tricuspid regurgitant velocity is 2.54 m/s, and with an assumed right atrial pressure of 15 mmHg, the estimated right ventricular systolic pressure is 40.8 mmHg. Left Atrium: Left atrial size was moderately dilated. Right Atrium: Right atrial size was moderately dilated. Pericardium: There is no evidence of pericardial effusion. Mitral Valve: The mitral valve is normal in structure. 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 Moderate to severe mitral valve regurgitation. No evidence of mitral valve stenosis. Tricuspid Valve: The tricuspid valve is normal in structure. Tricuspid valve regurgitation is mild . No evidence of tricuspid stenosis. Aortic Valve: The aortic valve is tricuspid. Aortic valve regurgitation is not visualized. Aortic valve sclerosis/calcification is present, without any evidence of aortic stenosis. Pulmonic Valve: The pulmonic valve was normal in structure. Pulmonic valve regurgitation is not visualized. No evidence of pulmonic stenosis. Aorta: The aortic root is normal in size and structure. Venous: The inferior vena cava is dilated in size with less than 50% respiratory variability, suggesting right atrial pressure of 15 mmHg. IAS/Shunts: No atrial level shunt  detected by color flow Doppler. Additional Comments: Prolapse of posterior MV leaflet best seen on apical 4 chamber view with moderate to severe MR; suggest TEE to further assess.  LEFT VENTRICLE PLAX 2D LVIDd:         5.60 cm      Diastology LVIDs:         4.60 cm      LV e' medial:    5.77 cm/s LV PW:         0.90 cm      LV E/e' medial:  11.5 LV IVS:        0.90 cm      LV e' lateral:   9.36 cm/s LVOT diam:     2.30 cm      LV E/e' lateral: 7.1 LV SV:         51 LV SV Index:   22 LVOT Area:  4.15 cm  LV Volumes (MOD) LV vol d, MOD A2C: 158.0 ml LV vol d, MOD A4C: 161.0 ml LV vol s, MOD A2C: 93.8 ml LV vol s, MOD A4C: 104.0 ml LV SV MOD A2C:     64.2 ml LV SV MOD A4C:     161.0 ml LV SV MOD BP:      59.9 ml RIGHT VENTRICLE RV S prime:     9.14 cm/s TAPSE (M-mode): 1.3 cm LEFT ATRIUM             Index        RIGHT ATRIUM           Index LA diam:        3.70 cm 1.60 cm/m   RA Area:     27.50 cm LA Vol (A2C):   95.6 ml 41.44 ml/m  RA Volume:   104.00 ml 45.08 ml/m LA Vol (A4C):   82.4 ml 35.72 ml/m LA Biplane Vol: 95.4 ml 41.36 ml/m  AORTIC VALVE LVOT Vmax:   73.10 cm/s LVOT Vmean:  59.200 cm/s LVOT VTI:    0.123 m  AORTA Ao Root diam: 3.30 cm Ao Asc diam:  3.70 cm MITRAL VALVE                  TRICUSPID VALVE MV Area (PHT): 1.85 cm       TR Peak grad:   25.8 mmHg MV Decel Time: 410 msec       TR Vmax:        254.00 cm/s MR Peak grad:    109.0 mmHg MR Mean grad:    65.0 mmHg    SHUNTS MR Vmax:         522.00 cm/s  Systemic VTI:  0.12 m MR Vmean:        378.0 cm/s   Systemic Diam: 2.30 cm MR PISA:         6.28 cm MR PISA Eff ROA: 38 mm MR PISA Radius:  1.00 cm MV E velocity: 66.60 cm/s MV A velocity: 31.90 cm/s MV E/A ratio:  2.09 Kirk Ruths MD Electronically signed by Kirk Ruths MD Signature Date/Time: 07/20/2022/3:49:06 PM    Final      I have independently reviewed the above radiologic studies and discussed with the patient   Recent Lab Findings: Lab Results  Component Value Date   WBC  9.3 07/22/2022   HGB 12.2 (L) 07/22/2022   HCT 36.0 (L) 07/22/2022   PLT 213 07/22/2022   GLUCOSE 111 (H) 07/22/2022   CHOL 170 07/21/2022   TRIG 62 07/21/2022   HDL 36 (L) 07/21/2022   LDLCALC 122 (H) 07/21/2022   ALT 54 (H) 07/22/2022   AST 40 07/22/2022   NA 145 07/22/2022   K 3.8 07/22/2022   CL 108 07/22/2022   CREATININE 1.36 (H) 07/22/2022   CREATININE 1.40 (H) 07/22/2022   BUN 19 07/22/2022   CO2 21 (L) 07/22/2022   TSH 2.380 07/20/2022   HGBA1C 6.2 (H) 07/21/2022      Assessment / Plan:     62 yo active male who presented with SVT (flutter) and required cardioversion and now with depressed LV and Rv function (improving), Severe MR with prolapse of P2 and moderate TR with moderate PHTN and improving CRI. Pt has no CAD.  Pt would benefit I believe with more medical management over the next several weeks to allow for recovery of both ventricles and kidneys. Will need to obtain CTA of  chest to pelvis and this can be done as an outpt later after kidneys recover to assess access for Mini thorocotomy approach. I feel that if EP could ablate flutter catheter based, would allow for mini approach since that ablation(MAZE0 might not be appropriate for this SVT and need to determine that prior to choosing approach.  I would see pt as outpt to discuss options in about 2-3 weeks and at that time assess his renal function and perhaps an echo to assess his ventricular function. Discussed this with pt and he understands and agrees to plan     I  spent 60 min in reviewing chart and in  counseling the patient face to face.   @ME1 @ 07/22/2022 12:07 PM

## 2022-07-22 NOTE — Heart Team MDD (Cosign Needed)
   Heart Team Multi-Disciplinary Discussion  Patient: Angel Lyons  DOB: May 20, 1961  MRN: [id number]   Date: 07/22/2022  11:02 AM    Attendees: Interventional Cardiology: Quay Burow, MD Kathlyn Sacramento, MD Bing Quarry, MD Glenetta Hew, MD Vernell Leep, MD Peter Martinique, MD Nelva Bush, MD Casandra Doffing, MD      Patient History: Pt presented to ED on 07/20/22 after episode while walking up stairs associated with chest pressure and diaphoresis. Pt also reported several weeks of exertional CP and shortness of breath. Patient's HR in ED, 240's. Did not respond to vagal maneuvers or adenosine. Ultimately underwent cardioversion with return to sinus tachycardia with PVCs.   Risk Factors: Hypertension Additional Risk Factors: Sleep apnea, SVT   CAD History: Planned cardiac cath today 07/22/22.    Review of Prior Angiography and PCI Procedures: The Echo from 07/20/22 and the TEE from 07/21/22 were reviewed and discussed in detail. The Echo discussion included the prolapse of posterior MV leaflet with moderate to severe MR. The TEE findings were consistent with the echo findings.          Discussion: After presentation, consideration of treatment options occurred including inpatient versus outpatient ablation. There is a planned cardiac cath today to assess coronary artery disease.     Additional Recommendations/Special Instructions: After discussion, the team recommended to do the ablation as an inpatient. Readdress mitral valve later assuming no coronary artery disease present on cath today.      Dan Europe, RN  07/22/2022 11:02 AM    Discussed with heart team.  Await cath results.  If no CAD, ablation may be more urgent as MR appears chronic.  Overall plan to be determined with input from EP and cardiac surgery as well.

## 2022-07-22 NOTE — Progress Notes (Signed)
   Heart Failure Stewardship Pharmacist Progress Note   PCP: Foye Spurling, MD (Inactive) PCP-Cardiologist: None    HPI:  62 yo M with PMH of HTN and OSA.  Presented to the ED on 1/17 with shortness of breath and chest tightness, found to be in SVT. Given adenosine x2 and vagal maneuvers without success, was successfully cardioverted in the ED. CXR with cardiac enlargement, no edema. ECHO 1/17 showed LVEF 40-45% with global hypokinesis, moderate LVH, G2DD, RV moderately reduced, moderate-severe MR. 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 MR is directed more anterior into the left atrium with some splaying of the regurgitant signal and there is a small amount of back flow into the right upper pulmonary vein consistent with severe MR. Taken for [facility] today.  Current HF Medications: Beta Blocker: metoprolol tartrate 75 mg BID  Prior to admission HF Medications: None  Pertinent Lab Values: Serum creatinine 1.36, BUN 19, Potassium 3.8, Sodium 145, BNP 114.4, Magnesium 1.8, A1c 6.2   Vital Signs: Weight: 257 lbs Blood pressure: 140/90s  Heart rate: 80-90s  I/O: incomplete  Medication Assistance / Insurance Benefits Check: Does the patient have prescription insurance?  Yes Type of insurance plan: [employer] Solis:  Prior to admission outpatient pharmacy: [facility] Is the patient willing to use [facility] at discharge? Yes Is the patient willing to transition their outpatient pharmacy to utilize a [facility] outpatient pharmacy?   Pending    Assessment: 1. 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 NYHA class II symptoms. - Not volume overloaded on exam, no indication for diuretics - Continue metoprolol tartrate 75 mg BID - Consider starting MRA and Farxiga 10 mg daily after cath   Plan: 1)  Medication changes recommended at this time: - Start spironolactone 12.5 mg daily - Start Farxia 10 mg daily  2) Patient assistance: Delene Loll copay $40 - copay card lowers to $10 per fill - Farxiga copay $40 - copay card lowers to $0 per month  3)  Education  - To be completed prior to discharge  Kerby Nora, PharmD, BCPS Heart Failure Stewardship Pharmacist Phone [phone]

## 2022-07-23 ENCOUNTER — Other Ambulatory Visit: Payer: Self-pay | Admitting: Cardiology

## 2022-07-23 DIAGNOSIS — R7989 Other specified abnormal findings of blood chemistry: Secondary | ICD-10-CM

## 2022-07-23 DIAGNOSIS — I471 Supraventricular tachycardia, unspecified: Secondary | ICD-10-CM | POA: Diagnosis not present

## 2022-07-23 DIAGNOSIS — I340 Nonrheumatic mitral (valve) insufficiency: Secondary | ICD-10-CM | POA: Diagnosis not present

## 2022-07-23 DIAGNOSIS — R079 Chest pain, unspecified: Secondary | ICD-10-CM | POA: Diagnosis not present

## 2022-07-23 DIAGNOSIS — N179 Acute kidney failure, unspecified: Secondary | ICD-10-CM

## 2022-07-23 LAB — BASIC METABOLIC PANEL
Anion gap: 7 (ref 5–15)
BUN: 16 mg/dL (ref 8–23)
CO2: 23 mmol/L (ref 22–32)
Calcium: 8.9 mg/dL (ref 8.9–10.3)
Chloride: 108 mmol/L (ref 98–111)
Creatinine, Ser: 1.23 mg/dL (ref 0.61–1.24)
GFR, Estimated: >60 mL/min (ref >60)
Glucose, Bld: 86 mg/dL (ref 70–99)
Potassium: 3.7 mmol/L (ref 3.5–5.1)
Sodium: 138 mmol/L (ref 135–145)

## 2022-07-23 MED ORDER — SACUBITRIL-VALSARTAN 24-26 MG PO TABS
1.0000 | ORAL_TABLET | Freq: Two times a day (BID) | ORAL | Status: DC
  Administered 2022-07-23: 1 via ORAL
  Filled 2022-07-23: qty 1

## 2022-07-23 MED ORDER — NITROGLYCERIN 0.4 MG SL SUBL: 0.4000 mg | SUBLINGUAL_TABLET | SUBLINGUAL | 1 refills | Status: DC | PRN

## 2022-07-23 MED ORDER — ASPIRIN 81 MG PO TBEC: 81.0000 mg | DELAYED_RELEASE_TABLET | Freq: Every day | ORAL | 12 refills | Status: DC

## 2022-07-23 MED ORDER — METOPROLOL SUCCINATE ER 100 MG PO TB24: 100.0000 mg | ORAL_TABLET | Freq: Every day | ORAL | 2 refills | Status: DC

## 2022-07-23 MED ORDER — METOPROLOL SUCCINATE ER 100 MG PO TB24
100.0000 mg | ORAL_TABLET | Freq: Every day | ORAL | Status: DC
  Administered 2022-07-23: 100 mg via ORAL
  Filled 2022-07-23: qty 1

## 2022-07-23 MED ORDER — SACUBITRIL-VALSARTAN 24-26 MG PO TABS: 1.0000 | ORAL_TABLET | Freq: Two times a day (BID) | ORAL | 2 refills | Status: DC

## 2022-07-23 NOTE — Discharge Summary (Signed)
Discharge Summary    Patient ID: ABDIRIZAK RICHISON MRN: [id number]; DOB: 1960-09-06  Admit date: 07/20/2022 Discharge date: 07/23/2022  PCP:  Foye Spurling, MD (Inactive)   [place] Providers Cardiologist:  None        Discharge Diagnoses    Principal Problem:   SVT (supraventricular tachycardia) Active Problems:   Mitral valve insufficiency   Chest pain    Diagnostic Studies/Procedures    07/20/22 TTE  IMPRESSIONS     1. Prolapse of posterior MV leaflet best seen on apical 4 chamber view  with moderate to severe MR; suggest TEE to further assess.   2. Left ventricular ejection fraction, by estimation, is 40 to 45%. The  left ventricle has mildly decreased function. The left ventricle  demonstrates global hypokinesis. There is moderate left ventricular  hypertrophy. Left ventricular diastolic  parameters are consistent with Grade II diastolic dysfunction  (pseudonormalization).   3. Right ventricular systolic function is moderately reduced. The right  ventricular size is moderately enlarged. There is mildly elevated  pulmonary artery systolic pressure.   4. Left atrial size was moderately dilated.   5. Right atrial size was moderately dilated.   6. The mitral valve is normal in structure. Moderate to severe mitral  valve regurgitation. No evidence of mitral stenosis. 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   7. The aortic valve is tricuspid. Aortic valve regurgitation is not  visualized. Aortic valve sclerosis/calcification is present, without any  evidence of aortic stenosis.   8. The inferior vena cava is dilated in size with <50% respiratory  variability, suggesting right atrial pressure of 15 mmHg.   Comparison(s): No prior Echocardiogram.   FINDINGS   Left Ventricle: Left ventricular ejection fraction, by estimation, is 40  to 45%. The left ventricle has mildly decreased function. The left  ventricle  demonstrates global hypokinesis. The left ventricular internal  cavity size was normal in size. There is   moderate left ventricular hypertrophy. Left ventricular diastolic  parameters are consistent with Grade II diastolic dysfunction  (pseudonormalization).   Right Ventricle: The right ventricular size is moderately enlarged. Right  ventricular systolic function is moderately reduced. There is mildly  elevated pulmonary artery systolic pressure. The tricuspid regurgitant  velocity is 2.54 m/s, and with an  assumed right atrial pressure of 15 mmHg, the estimated right ventricular  systolic pressure is 37.8 mmHg.   Left Atrium: Left atrial size was moderately dilated.   Right Atrium: Right atrial size was moderately dilated.   Pericardium: There is no evidence of pericardial effusion.   Mitral Valve: The mitral valve is normal in structure. 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 Moderate  to severe mitral valve regurgitation. No evidence of mitral valve  stenosis.   Tricuspid Valve: The tricuspid valve is normal in structure. Tricuspid  valve regurgitation is mild . No evidence of tricuspid stenosis.   Aortic Valve: The aortic valve is tricuspid. Aortic valve regurgitation is  not visualized. Aortic valve sclerosis/calcification is present, without  any evidence of aortic stenosis.   Pulmonic Valve: The pulmonic valve was normal in structure. Pulmonic valve  regurgitation is not visualized. No evidence of pulmonic stenosis.   Aorta: The aortic root is normal in size and structure.   Venous: The inferior vena cava is dilated in size with less than 50%  respiratory variability, suggesting right atrial pressure of 15 mmHg.   IAS/Shunts: No  atrial level shunt detected by color flow Doppler.   Additional Comments: Prolapse of posterior MV leaflet best seen on apical  4 chamber view with moderate to severe MR; suggest TEE to further  assess.   07/21/22 TEE  IMPRESSIONS     1. The left ventricle has low normal function.   2. Right ventricular systolic function is mildly reduced. The right  ventricular size is normal.   3. No left atrial/left atrial appendage thrombus was detected.   4. Prolapes of teh P2 segment of the posterior mitral leaflet with  noncoaptation in this region. MR is directed more anterior into LA with  some splaying of the regurgitant signal.. There is a small amount of back  flow into R upper pulmonary vein  consistent with severe MR (at SBP of 93 mm Hg).   5. Tricuspid valve regurgitation is mild to moderate.   6. The aortic valve is tricuspid. Aortic valve regurgitation is trivial.  Aortic valve sclerosis is present, with no evidence of aortic valve  stenosis.   FINDINGS   Left Ventricle: The left ventricle has low normal function. The left  ventricular internal cavity size was normal in size.   Right Ventricle: The right ventricular size is normal. Right ventricular  systolic function is mildly reduced.   Left Atrium: Left atrial size was normal in size. No left atrial/left  atrial appendage thrombus was detected.   Pericardium: There is no evidence of pericardial effusion.   Mitral Valve: Prolapes of teh P2 segment of the posterior mitral leaflet  with noncoaptation in this region. MR is directed more anterior into LA  with some splaying of the regurgitant signal.. There is a small amount of  back flow into R upper pulmonary  vein consistent with severe MR (at SBP of 93 mm Hg).   Tricuspid Valve: The tricuspid valve is normal in structure. Tricuspid  valve regurgitation is mild to moderate.   Aortic Valve: The aortic valve is tricuspid. Aortic valve regurgitation is  trivial. Aortic valve sclerosis is present, with no evidence of aortic  valve stenosis.   Pulmonic Valve: The pulmonic valve was grossly normal. Pulmonic valve  regurgitation is trivial.   Aorta: The aortic root  is normal in size and structure. There is minimal  (Grade I) plaque.   Additional Comments: Spectral Doppler performed.   MR Peak grad: 111.1 mmHg  MR Mean grad: 77.0 mmHg  MR Vmax:      527.00 cm/s  MR Vmean:     424.0 cm/s   07/22/22 LHC/RHC  POST-OPERATIVE DIAGNOSIS:   Angiographically minimal Mild pulm hypertension with PAP-mean 2 49/9 -9 mmHg.  PCWP 19 mmHg with a large V wave to 33 mmHg LVEDP 17 mmHg.  Flowsheet Row Most Recent Value  Fick Cardiac Output 5.79 L/min  Fick Cardiac Output Index 2.52 (L/min)/BSA  RA A Wave 12 mmHg  RA V Wave 10 mmHg  RA Mean 7 mmHg  RV Systolic Pressure 46 mmHg  RV Diastolic Pressure 2 mmHg  RV EDP 12 mmHg  PA Systolic Pressure 49 mmHg  PA Diastolic Pressure 9 mmHg  PA Mean 29 mmHg  PW A Wave 19 mmHg  PW V Wave 33 mmHg  PW Mean 14 mmHg  AO Systolic Pressure 709 mmHg  AO Diastolic Pressure 76 mmHg  AO Mean 96 mmHg  LV Systolic Pressure 628 mmHg  LV Diastolic Pressure 25 mmHg  LV EDP 17 mmHg  AOp Systolic Pressure 366 mmHg  AOp Diastolic Pressure 67  mmHg  AOp Mean Pressure 89 mmHg  LVp Systolic Pressure 0000000 mmHg  LVp Diastolic Pressure 42 mmHg  LVp EDP Pressure 12 mmHg  QP/QS 1  TPVR Index 11.53 HRUI  TSVR Index 38.16 HRUI  PVR SVR Ratio 0.17  TPVR/TSVR Ratio 0.3   _____________   History of Present Illness     Shabazz Darene Lamer Mcghie is a 62 y.o. male with a history of HTN and OSA.  Lawarence DAIYON LUFT presented to the ED on 1/17 after experiencing dyspnea with exertion. Co workers noted that he did not appear well and EMS was activated. They found patient in SVT with a rate of 240bpm. While in route he was given a adenosine 6 mg with no change in rhythm.  On arrival to the ED, vagal maneuvers were attempted without success.  He was also given an additional 12 mg of adenosine without success.  He underwent successful cardioversion with return to sinus tachycardia with frequent PVCs. 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 When frequency increased, he discussed with PCP who recommended a cardiology evaluation.  Hospital Course     Consultants: EP, CTS  SVT, suspected 1:1 atrial flutter  Patient developed rapid HR while climbing steps at work. Vagal maneuvers and Adenosine were unsuccessful in restoring a normal rhythm and patient was cardioverted in the ED. Due to patient's age, EP was asked to evaluate. They recommended starting Metoprolol and planning outpatient EP study and ablation.   On day of discharge, patient without recurrence of SVT. Bigeminy PVCs noted overnight on telemetry, no symptoms Discharge on Toprol XL 100mg  daily.  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 He subsequently underwent TEE. 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 MR is directed more anterior into the left atrium with some splaying of the regurgitant signal and there is a small amount of back flow into the right upper pulmonary vein consistent with severe MR.   Patient evaluated by Dr. Lavonna Monarch with CT surgery, recommends medical management to allow heart/kidneys to recover. They plan outpatient follow up in 2-[redacted] weeks along with repeat echocardiogram.  Could consider cardiac MRI to further assess MR severity  Chest pain HFmrEF New cardiomyopathy  Echo this admission showed LVEF of 40-45% with global hypokinesis. Felt to be secondary to tachyarrhythmia. LHC was angiographically minimal. RHC noted mild pulm hypertension with PAP-mean 2 49/9 -9 mmHg.  PCWP 19 mmHg with a large V wave to 33 mmHg LVEDP 17 mmHg.  Patient remains euvolemic on exam. Will discharge on Toprol XL 100mg  Start Entresto  24-26 mg BID.  Hypertension  Patient with stable BP. Will consolidate metoprolol to Toprol XL 100mg  QD and start Entresto 24-26mg . Stop amlodipine.   AKI  Patient found with AKI, creatinine up to 1.85 with unknown baseline. Now improved to 1.23. Patient should have repeat metabolic panel in 1 week.   Elevated LFTs  Patient with AST 89, ALT 82 on admission. Now improving, 40, 54 respectively. Recommend follow CMP in 1 week.  Pre-diabetes  Patient with A1c 6.2%. Recommend outpatient follow up with PCP.  Dyslipidemia  Patient with total Cholesterol 170, Triglycerides 62, HDL 36, LDL  43. Given recently elevated LFTs, will defer initiation of a statin at this time.    Did the patient have an acute coronary syndrome (MI, NSTEMI, STEMI, etc) this admission?:  No                               Did the patient have a percutaneous coronary intervention (stent / angioplasty)?:  No.          _____________  Discharge Vitals Blood pressure (!) 137/92, pulse 64, temperature 98.4 F (36.9 C), temperature source Oral, resp. rate 16, height 6' (1.829 m), weight 116.9 kg, SpO2 94 %.  Filed Weights   07/20/22 0947 07/21/22 1028 07/22/22 0412  Weight: 99.8 kg 108.9 kg 116.9 kg    Labs & Radiologic Studies    CBC Recent Labs    07/21/22 0855 07/22/22 0721 07/22/22 0953 07/22/22 1001 07/22/22 1002  WBC 9.6 9.3  --   --   --   HGB 14.4 13.9   < > 12.6* 12.2*  HCT 43.3 43.6   < > 37.0* 36.0*  MCV 89.3 92.0  --   --   --   PLT 245 213  --   --   --    < > = values in this interval not displayed.   Basic Metabolic Panel Recent Labs    07/22/22 0721 07/22/22 0953 07/22/22 1002 07/23/22 0244  NA 139   < > 145 138  K 4.1   < > 3.8 3.7  CL 108  --   --  108  CO2 21*  --   --  23  GLUCOSE 111*  --   --  86  BUN 19  --   --  16  CREATININE 1.40*  1.36*  --   --  1.23  CALCIUM 8.9  --   --  8.9   < > = values in this interval not displayed.   Liver Function Tests Recent Labs     07/21/22 0855 07/22/22 0721  AST 51* 40  ALT 64* 54*  ALKPHOS 54 48  BILITOT 1.3* 1.5*  PROT 6.9 6.8  ALBUMIN 3.8 3.7   No results for input(s): "LIPASE", "AMYLASE" in the last 72 hours. High Sensitivity Troponin:   Recent Labs  Lab 07/20/22 0847 07/20/22 1124  TROPONINIHS 10 36*    BNP Invalid input(s): "POCBNP" D-Dimer No results for input(s): "DDIMER" in the last 72 hours. Hemoglobin A1C Recent Labs    07/21/22 0515  HGBA1C 6.2*   Fasting Lipid Panel Recent Labs    07/21/22 0515  CHOL 170  HDL 36*  LDLCALC 122*  TRIG 62  CHOLHDL 4.7   Thyroid Function Tests No results for input(s): "TSH", "T4TOTAL", "T3FREE", "THYROIDAB" in the last 72 hours.  Invalid input(s): "FREET3" _____________  ECHO TEE  Result Date: 07/21/2022    TRANSESOPHOGEAL ECHO REPORT   Patient Name:   YVONNE STUEBER Date of Exam: 07/21/2022 Medical Rec #:  [id number]        Height:       76.0 in Accession #:    [id number]       Weight:       220.0 lb Date of Birth:  [birth year]        BSA:          2.307 m Patient Age:    67 years         BP:  100/65 mmHg Patient Gender: M                HR:           82 bpm. Exam Location:  Inpatient Procedure: Transesophageal Echo, 3D Echo, Color Doppler and Cardiac Doppler Indications:     Mitral Regurgitation  History:         Patient has prior history of Echocardiogram examinations, most                  recent 07/20/2022. Signs/Symptoms:Shortness of Breath; Risk                  Factors:Sleep Apnea and Hypertension.  Sonographer:     Bernadene Person RDCS Referring Phys:  Dorris Carnes, V Diagnosing Phys: Dorris Carnes MD PROCEDURE: After discussion of the risks and benefits of a TEE, an informed consent was obtained from the patient. The transesophogeal probe was passed without difficulty through the esophogus of the patient. Local oropharyngeal anesthetic was provided with viscous lidocaine. Sedation performed by different physician. The patient was monitored  while under deep sedation. Anesthestetic sedation was provided intravenously by Anesthesiology: 538.22mg  of Propofol. The patient's vital signs; including heart rate, blood pressure, and oxygen saturation; remained stable throughout the procedure. The patient developed no complications during the procedure.  IMPRESSIONS  1. The left ventricle has low normal function.  2. Right ventricular systolic function is mildly reduced. The right ventricular size is normal.  3. No left atrial/left atrial appendage thrombus was detected.  4. Prolapes of teh P2 segment of the posterior mitral leaflet with noncoaptation in this region. MR is directed more anterior into LA with some splaying of the regurgitant signal.. There is a small amount of back flow into R upper pulmonary vein consistent with severe MR (at SBP of 93 mm Hg).  5. Tricuspid valve regurgitation is mild to moderate.  6. The aortic valve is tricuspid. Aortic valve regurgitation is trivial. Aortic valve sclerosis is present, with no evidence of aortic valve stenosis. FINDINGS  Left Ventricle: The left ventricle has low normal function. The left ventricular internal cavity size was normal in size. Right Ventricle: The right ventricular size is normal. Right ventricular systolic function is mildly reduced. Left Atrium: Left atrial size was normal in size. No left atrial/left atrial appendage thrombus was detected. Pericardium: There is no evidence of pericardial effusion. Mitral Valve: Prolapes of teh P2 segment of the posterior mitral leaflet with noncoaptation in this region. MR is directed more anterior into LA with some splaying of the regurgitant signal.. There is a small amount of back flow into R upper pulmonary vein consistent with severe MR (at SBP of 93 mm Hg). Tricuspid Valve: The tricuspid valve is normal in structure. Tricuspid valve regurgitation is mild to moderate. Aortic Valve: The aortic valve is tricuspid. Aortic valve regurgitation is trivial.  Aortic valve sclerosis is present, with no evidence of aortic valve stenosis. Pulmonic Valve: The pulmonic valve was grossly normal. Pulmonic valve regurgitation is trivial. Aorta: The aortic root is normal in size and structure. There is minimal (Grade I) plaque. Additional Comments: Spectral Doppler performed. MR Peak grad: 111.1 mmHg MR Mean grad: 77.0 mmHg MR Vmax:      527.00 cm/s MR Vmean:     424.0 cm/s Dorris Carnes MD Electronically signed by Dorris Carnes MD Signature Date/Time: 07/21/2022/5:37:05 PM    Final    ECHOCARDIOGRAM COMPLETE  Result Date: 07/20/2022    ECHOCARDIOGRAM REPORT   Patient Name:  Lorna Few Date of Exam: 07/20/2022 Medical Rec #:  [id number]        Height:       76.0 in Accession #:    [id number]       Weight:       220.0 lb Date of Birth:  [birth year]        BSA:          2.307 m Patient Age:    83 years         BP:           177/113 mmHg Patient Gender: M                HR:           99 bpm. Exam Location:  Inpatient Procedure: 2D Echo, Color Doppler and Cardiac Doppler Indications:    R07.9* Chest pain, unspecified  History:        Patient has no prior history of Echocardiogram examinations.                 Risk Factors:Hypertension and Sleep Apnea.  Sonographer:    Raquel Sarna Senior RDCS Referring Phys: Cheryln Manly IMPRESSIONS  1. Prolapse of posterior MV leaflet best seen on apical 4 chamber view with moderate to severe MR; suggest TEE to further assess.  2. Left ventricular ejection fraction, by estimation, is 40 to 45%. The left ventricle has mildly decreased function. The left ventricle demonstrates global hypokinesis. There is moderate left ventricular hypertrophy. Left ventricular diastolic parameters are consistent with Grade II diastolic dysfunction (pseudonormalization).  3. Right ventricular systolic function is moderately reduced. The right ventricular size is moderately enlarged. There is mildly elevated pulmonary artery systolic pressure.  4. Left atrial size was  moderately dilated.  5. Right atrial size was moderately dilated.  6. The mitral valve is normal in structure. Moderate to severe mitral valve regurgitation. No evidence of mitral stenosis. 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  7. The aortic valve is tricuspid. Aortic valve regurgitation is not visualized. Aortic valve sclerosis/calcification is present, without any evidence of aortic stenosis.  8. The inferior vena cava is dilated in size with <50% respiratory variability, suggesting right atrial pressure of 15 mmHg. Comparison(s): No prior Echocardiogram. FINDINGS  Left Ventricle: Left ventricular ejection fraction, by estimation, is 40 to 45%. The left ventricle has mildly decreased function. The left ventricle demonstrates global hypokinesis. The left ventricular internal cavity size was normal in size. There is  moderate left ventricular hypertrophy. Left ventricular diastolic parameters are consistent with Grade II diastolic dysfunction (pseudonormalization). Right Ventricle: The right ventricular size is moderately enlarged. Right ventricular systolic function is moderately reduced. There is mildly elevated pulmonary artery systolic pressure. The tricuspid regurgitant velocity is 2.54 m/s, and with an assumed right atrial pressure of 15 mmHg, the estimated right ventricular systolic pressure is 0000000 mmHg. Left Atrium: Left atrial size was moderately dilated. Right Atrium: Right atrial size was moderately dilated. Pericardium: There is no evidence of pericardial effusion. Mitral Valve: The mitral valve is normal in structure. 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 Moderate to severe mitral valve regurgitation. No evidence of mitral valve stenosis. Tricuspid Valve: The tricuspid valve is normal in structure. Tricuspid valve regurgitation is mild . No evidence of tricuspid stenosis. Aortic Valve: The aortic valve is tricuspid.  Aortic valve regurgitation is not visualized. Aortic valve sclerosis/calcification is present, without any evidence of aortic  stenosis. Pulmonic Valve: The pulmonic valve was normal in structure. Pulmonic valve regurgitation is not visualized. No evidence of pulmonic stenosis. Aorta: The aortic root is normal in size and structure. Venous: The inferior vena cava is dilated in size with less than 50% respiratory variability, suggesting right atrial pressure of 15 mmHg. IAS/Shunts: No atrial level shunt detected by color flow Doppler. Additional Comments: Prolapse of posterior MV leaflet best seen on apical 4 chamber view with moderate to severe MR; suggest TEE to further assess.  LEFT VENTRICLE PLAX 2D LVIDd:         5.60 cm      Diastology LVIDs:         4.60 cm      LV e' medial:    5.77 cm/s LV PW:         0.90 cm      LV E/e' medial:  11.5 LV IVS:        0.90 cm      LV e' lateral:   9.36 cm/s LVOT diam:     2.30 cm      LV E/e' lateral: 7.1 LV SV:         51 LV SV Index:   22 LVOT Area:     4.15 cm  LV Volumes (MOD) LV vol d, MOD A2C: 158.0 ml LV vol d, MOD A4C: 161.0 ml LV vol s, MOD A2C: 93.8 ml LV vol s, MOD A4C: 104.0 ml LV SV MOD A2C:     64.2 ml LV SV MOD A4C:     161.0 ml LV SV MOD BP:      59.9 ml RIGHT VENTRICLE RV S prime:     9.14 cm/s TAPSE (M-mode): 1.3 cm LEFT ATRIUM             Index        RIGHT ATRIUM           Index LA diam:        3.70 cm 1.60 cm/m   RA Area:     27.50 cm LA Vol (A2C):   95.6 ml 41.44 ml/m  RA Volume:   104.00 ml 45.08 ml/m LA Vol (A4C):   82.4 ml 35.72 ml/m LA Biplane Vol: 95.4 ml 41.36 ml/m  AORTIC VALVE LVOT Vmax:   73.10 cm/s LVOT Vmean:  59.200 cm/s LVOT VTI:    0.123 m  AORTA Ao Root diam: 3.30 cm Ao Asc diam:  3.70 cm MITRAL VALVE                  TRICUSPID VALVE MV Area (PHT): 1.85 cm       TR Peak grad:   25.8 mmHg MV Decel Time: 410 msec       TR Vmax:        254.00 cm/s MR Peak grad:    109.0 mmHg MR Mean grad:    65.0 mmHg    SHUNTS MR Vmax:         522.00  cm/s  Systemic VTI:  0.12 m MR Vmean:        378.0 cm/s   Systemic Diam: 2.30 cm MR PISA:         6.28 cm MR PISA Eff ROA: 38 mm MR PISA Radius:  1.00 cm MV E velocity: 66.60 cm/s MV A velocity: 31.90 cm/s MV E/A ratio:  2.09 Kirk Ruths MD Electronically signed by Kirk Ruths MD Signature Date/Time: 07/20/2022/3:49:06 PM    Final  DG Chest Portable 1 View  Result Date: 07/20/2022 CLINICAL DATA:  SVT.  Shortness of breath and cough EXAM: PORTABLE CHEST 1 VIEW COMPARISON:  7/31/7 FINDINGS: Cardiac enlargement. There is no pleural effusion or edema. No airspace opacities identified. Review of the visualized osseous structures is unremarkable. IMPRESSION: No acute cardiopulmonary abnormalities. Cardiac enlargement Electronically Signed   By: Signa Kell M.D.   On: 07/20/2022 09:13   Disposition   Pt is being discharged home today in good condition.  Follow-up Plans & Appointments     Follow-up Information     [facility]. Go in 6 day(s).   Specialty: Cardiology Why: Hospital follow up 08/01/2022 @ 9 am PLEASE bring a current medication list to appointment FREE valet parking, Entrance C, off [employer] information: [address] [id number] mc [place] [address] [phone]               Discharge Instructions     Diet - low sodium heart healthy   Complete by: As directed    Increase activity slowly   Complete by: As directed         Discharge Medications   Allergies as of 07/23/2022   No Known Allergies      Medication List     TAKE these medications    aspirin EC 81 MG tablet Take 1 tablet (81 mg total) by mouth daily. Swallow whole. Start taking on: July 24, 2022   metoprolol succinate 100 MG 24 hr tablet Commonly known as: TOPROL-XL Take 1 tablet (100 mg total) by mouth daily. Take with or immediately following a meal. Start taking on: July 24, 2022    nitroGLYCERIN 0.4 MG SL tablet Commonly known as: NITROSTAT Place 1 tablet (0.4 mg total) under the tongue every 5 (five) minutes x 3 doses as needed for up to 60 doses for chest pain.   sacubitril-valsartan 24-26 MG Commonly known as: ENTRESTO Take 1 tablet by mouth 2 (two) times daily.           Outstanding Labs/Studies     Duration of Discharge Encounter   Greater than 30 minutes including physician time.  Con Memos, PA-C 07/23/2022, 9:40 AM

## 2022-07-23 NOTE — Progress Notes (Addendum)
Rounding Note    Patient Name: Angel Lyons Date of Encounter: 07/23/2022  [facility] Cardiologist: Dr. Eldridge Dace    Subjective   Cr improved to 1.2.  BP 137/92.  Denies any chest pain or dyspnea.  Inpatient Medications    Scheduled Meds:  amLODipine  5 mg Oral Daily   aspirin EC  81 mg Oral Daily   heparin  5,000 Units Subcutaneous Q8H   metoprolol tartrate  75 mg Oral BID   sodium chloride flush  3 mL Intravenous Q12H   sodium chloride flush  3 mL Intravenous Q12H   Continuous Infusions:  sodium chloride     PRN Meds: sodium chloride, acetaminophen, ALPRAZolam, nitroGLYCERIN, ondansetron (ZOFRAN) IV, sodium chloride flush, zolpidem   Vital Signs    Vitals:   07/22/22 1740 07/22/22 1942 07/23/22 0008 07/23/22 0530  BP: (!) 127/96 (!) 142/89 113/75 (!) 137/92  Pulse: 66  (!) 58 64  Resp: 15 16 18 16   Temp: 97.8 F (36.6 C) 97.8 F (36.6 C) 98 F (36.7 C) 98.4 F (36.9 C)  TempSrc: Oral Oral Oral Oral  SpO2: 100% 99% 99% 94%  Weight:      Height:        Intake/Output Summary (Last 24 hours) at 07/23/2022 0816 Last data filed at 07/22/2022 2153 Gross per 24 hour  Intake 1469.93 ml  Output 1 ml  Net 1468.93 ml      07/22/2022    4:12 AM 07/21/2022   10:28 AM 07/20/2022    9:47 AM  Last 3 Weights  Weight (lbs) 257 lb 11.2 oz 240 lb 220 lb  Weight (kg) 116.892 kg 108.863 kg 99.791 kg      Telemetry    Normal sinus rhythm with rates in the 70s to 90s. No recurrent SVT. He did have a run of bigeminy PVCs. - Personally Reviewed  ECG    Normal sinus rhythm, rate 77 bpm, with no acute ST/ T changes.  - Personally Reviewed  Physical Exam   GEN: African-American male resting comfortably in no acute distress.  Neck: No JVD Cardiac: RRR. No significant murmur appreciated. Respiratory: Clear to auscultation bilaterally. No wheezes, rhonchi, or rales. GI: Soft, non-distended, and non-tender. MS: No lower extremity edema. No deformity. Skin:  Warm and dry. Neuro:  No focal deficits. Psych: Normal affect   Labs    High Sensitivity Troponin:   Recent Labs  Lab 07/20/22 0847 07/20/22 1124  TROPONINIHS 10 36*      Chemistry Recent Labs  Lab 07/20/22 0847 07/21/22 0855 07/22/22 0721 07/22/22 0953 07/22/22 1001 07/22/22 1002 07/23/22 0244  NA 136 139 139   < > 144 145 138  K 3.9 4.1 4.1   < > 4.1 3.8 3.7  CL 108 105 108  --   --   --  108  CO2 19* 24 21*  --   --   --  23  GLUCOSE 254* 124* 111*  --   --   --  86  BUN 21 18 19   --   --   --  16  CREATININE 1.85* 1.53* 1.40*  1.36*  --   --   --  1.23  CALCIUM 8.5* 9.4 8.9  --   --   --  8.9  MG 1.8  --   --   --   --   --   --   PROT 6.3* 6.9 6.8  --   --   --   --  ALBUMIN 3.2* 3.8 3.7  --   --   --   --   AST 89* 51* 40  --   --   --   --   ALT 82* 64* 54*  --   --   --   --   ALKPHOS 58 54 48  --   --   --   --   BILITOT 0.6 1.3* 1.5*  --   --   --   --   GFRNONAA 40* 51* 56*  58*  --   --   --  >60  ANIONGAP 9 10 10   --   --   --  7   < > = values in this interval not displayed.     Lipids  Recent Labs  Lab 07/21/22 0515  CHOL 170  TRIG 62  HDL 36*  LDLCALC 122*  CHOLHDL 4.7     Hematology Recent Labs  Lab 07/20/22 0847 07/21/22 0855 07/22/22 0721 07/22/22 0953 07/22/22 1001 07/22/22 1002  WBC 10.6* 9.6 9.3  --   --   --   RBC 5.02 4.85 4.74  --   --   --   HGB 14.9 14.4 13.9 12.6* 12.6* 12.2*  HCT 44.5 43.3 43.6 37.0* 37.0* 36.0*  MCV 88.6 89.3 92.0  --   --   --   MCH 29.7 29.7 29.3  --   --   --   MCHC 33.5 33.3 31.9  --   --   --   RDW 12.4 12.7 12.6  --   --   --   PLT 256 245 213  --   --   --     Thyroid  Recent Labs  Lab 07/20/22 0847  TSH 2.380     BNP Recent Labs  Lab 07/20/22 0850  BNP 114.4*     DDimer No results for input(s): "DDIMER" in the last 168 hours.   Radiology    ECHO TEE  Result Date: 07/21/2022    TRANSESOPHOGEAL ECHO REPORT   Patient Name:   Angel Lyons Date of Exam: 07/21/2022  Medical Rec #:  07/23/2022        Height:       76.0 in Accession #:    [id number]       Weight:       220.0 lb Date of Birth:  Nov 24, 1960        BSA:          2.307 m Patient Age:    61 years         BP:           100/65 mmHg Patient Gender: M                HR:           82 bpm. Exam Location:  Inpatient Procedure: Transesophageal Echo, 3D Echo, Color Doppler and Cardiac Doppler Indications:     Mitral Regurgitation  History:         Patient has prior history of Echocardiogram examinations, most                  recent 07/20/2022. Signs/Symptoms:Shortness of Breath; Risk                  Factors:Sleep Apnea and Hypertension.  Sonographer:     07/22/2022 RDCS Referring Phys:  Eulah Pont, V Diagnosing Phys: Dietrich Pates MD PROCEDURE: After discussion of the risks and benefits  of a TEE, an informed consent was obtained from the patient. The transesophogeal probe was passed without difficulty through the esophogus of the patient. Local oropharyngeal anesthetic was provided with viscous lidocaine. Sedation performed by different physician. The patient was monitored while under deep sedation. Anesthestetic sedation was provided intravenously by Anesthesiology: 538.22mg  of Propofol. The patient's vital signs; including heart rate, blood pressure, and oxygen saturation; remained stable throughout the procedure. The patient developed no complications during the procedure.  IMPRESSIONS  1. The left ventricle has low normal function.  2. Right ventricular systolic function is mildly reduced. The right ventricular size is normal.  3. No left atrial/left atrial appendage thrombus was detected.  4. Prolapes of teh P2 segment of the posterior mitral leaflet with noncoaptation in this region. MR is directed more anterior into LA with some splaying of the regurgitant signal.. There is a small amount of back flow into R upper pulmonary vein consistent with severe MR (at SBP of 93 mm Hg).  5. Tricuspid valve regurgitation is mild  to moderate.  6. The aortic valve is tricuspid. Aortic valve regurgitation is trivial. Aortic valve sclerosis is present, with no evidence of aortic valve stenosis. FINDINGS  Left Ventricle: The left ventricle has low normal function. The left ventricular internal cavity size was normal in size. Right Ventricle: The right ventricular size is normal. Right ventricular systolic function is mildly reduced. Left Atrium: Left atrial size was normal in size. No left atrial/left atrial appendage thrombus was detected. Pericardium: There is no evidence of pericardial effusion. Mitral Valve: Prolapes of teh P2 segment of the posterior mitral leaflet with noncoaptation in this region. MR is directed more anterior into LA with some splaying of the regurgitant signal.. There is a small amount of back flow into R upper pulmonary vein consistent with severe MR (at SBP of 93 mm Hg). Tricuspid Valve: The tricuspid valve is normal in structure. Tricuspid valve regurgitation is mild to moderate. Aortic Valve: The aortic valve is tricuspid. Aortic valve regurgitation is trivial. Aortic valve sclerosis is present, with no evidence of aortic valve stenosis. Pulmonic Valve: The pulmonic valve was grossly normal. Pulmonic valve regurgitation is trivial. Aorta: The aortic root is normal in size and structure. There is minimal (Grade I) plaque. Additional Comments: Spectral Doppler performed. MR Peak grad: 111.1 mmHg MR Mean grad: 77.0 mmHg MR Vmax:      527.00 cm/s MR Vmean:     424.0 cm/s Dietrich Pates MD Electronically signed by Dietrich Pates MD Signature Date/Time: 07/21/2022/5:37:05 PM    Final     Cardiac Studies   TTE 07/20/2022: 1. Prolapse of posterior MV leaflet best seen on apical 4 chamber view  with moderate to severe MR; suggest TEE to further assess.   2. Left ventricular ejection fraction, by estimation, is 40 to 45%. The  left ventricle has mildly decreased function. The left ventricle  demonstrates global hypokinesis.  There is moderate left ventricular  hypertrophy. Left ventricular diastolic  parameters are consistent with Grade II diastolic dysfunction  (pseudonormalization).   3. Right ventricular systolic function is moderately reduced. The right  ventricular size is moderately enlarged. There is mildly elevated  pulmonary artery systolic pressure.   4. Left atrial size was moderately dilated.   5. Right atrial size was moderately dilated.   6. The mitral valve is normal in structure. Moderate to severe mitral  valve regurgitation. No evidence of mitral stenosis. There is severe  holosystolic prolapse of posterior leaflet of the mitral  valve.   7. The aortic valve is tricuspid. Aortic valve regurgitation is not  visualized. Aortic valve sclerosis/calcification is present, without any  evidence of aortic stenosis.   8. The inferior vena cava is dilated in size with <50% respiratory  variability, suggesting right atrial pressure of 15 mmHg.  _______________  TEE 07/21/2022: Impressions:  1. The left ventricle has low normal function.   2. Right ventricular systolic function is mildly reduced. The right  ventricular size is normal.   3. No left atrial/left atrial appendage thrombus was detected.   4. Prolapes of teh P2 segment of the posterior mitral leaflet with  noncoaptation in this region. MR is directed more anterior into LA with  some splaying of the regurgitant signal.. There is a small amount of back  flow into R upper pulmonary vein  consistent with severe MR (at SBP of 93 mm Hg).   5. Tricuspid valve regurgitation is mild to moderate.   6. The aortic valve is tricuspid. Aortic valve regurgitation is trivial.  Aortic valve sclerosis is present, with no evidence of aortic valve  stenosis.   Patient Profile     62 y.o. male with a history of hypertension and obstructive sleep apnea but no known heart disease p/w SVT   Assessment & Plan     SVT: Presented in SVT with rates in the  240s. He did not respond to Adenosine, required cardioversion. Echo mildly reduced EF of 40-45% with global hypokinesis. EP was consulted due to concern for possible 1:1 atrial flutter and have recommended EP study and ablation in the future. They recommending avoiding Amiodarone so not to interfere with the EP study. - No recurrent SVT. He did have a run of bigeminy PVCs this morning. Asymptomatic with this. - Currently on Lopressor 75 twice daily. Will consolidate to toprol XL.  Has had some bradycardia, will switch to toprol XL 100 mg daily   Chest Pain: Patient also described exertion chest tightness and dyspnea. High-sensitivity troponin 10 >> 36 but this is in the setting of SVT with heart rates >200 bpm and consistent with demand ischemia. Echo showed LVEF of 40-45% with global hypokinesis as well as prolapse of posterior MV leaflet with moderate to severe MR. LHC 07/22/22 with minimal CAD   Severe Mitral Regurgitation: TTE showed prolapse of posterior MV leaflet and moderate to severe MR. TEE confirmed prolapse of the P2 segment of the posterior mitral leaflet with severe MR.  RHC shows PCWP 19 with large V waves up to 33 mmHg -Evaluated by Dr Lavonna Monarch in CT surgery, recommend medical management for now to allow time for ventricles and kidneys to recover.  Planning outpatient f/u in 2-3 weeks, along with echo to assess LV function -Interestingly, no significant murmur on exam.  Would consider cardiac MRI as outpatient to quantify MR severity   Acute combined heart failure: Echo this admission showed LVEF of 40-45% with global hypokinesis. May be secondary to tachyarrhythmia.  - Euvolemic on exam. - Continue beta-blocker as above.  - Given resolution of AKI, will add Entresto 24-26 mg twice daily   Hypertension: currently on lopressor 75 mg BID and amlodipine 5 mg daily.  Recommend discontinuing amlodipine and add Entresto as above.   Dyslipidemia: Lipid panel this admission: Total Cholesterol  170, Triglycerides 62, HDL 36, LDL 122.  - Not currently on a statin.  Pre-Diabetes: Hemoglobin A1c 6.2% this admission.   Elevated LFTs: AST and ALT elevated at 89 and 82 respectively on admission, improving  AKI: Creatinine 1.85 on admission. Unclear baseline.  Has improved, creatinine down to 1.2  Disposition: Patient adamant about discharge today.  Needs BMET within 1 week.  Would discharge on toprol XL and entresto as above.  Has f/u scheduled for 1/29.  For questions or updates, please contact [place] Please consult [email or web address] for contact info under        Signed, Donato Heinz, MD  07/23/2022, 8:16 AM

## 2022-07-23 NOTE — Progress Notes (Signed)
   07/23/22 1000  Mobility  Activity Ambulated independently in hallway  Level of Assistance Independent  Assistive Device None  Distance Ambulated (ft) 400 ft  Activity Response Tolerated well  Mobility Referral Yes  $Mobility charge 1 Mobility   Mobility Specialist Progress Note  Received pt in chair having no complaints and agreeable to mobility. Pt was asymptomatic throughout ambulation and returned to room w/o fault. Left in chair w/ call bell in reach and all needs met.   Lucious Groves Mobility Specialist  Please contact via SecureChat or Rehab office at [phone]

## 2022-07-24 ENCOUNTER — Encounter (HOSPITAL_COMMUNITY): Payer: Self-pay | Admitting: Internal Medicine

## 2022-07-25 ENCOUNTER — Telehealth: Payer: Self-pay | Admitting: *Deleted

## 2022-07-25 ENCOUNTER — Other Ambulatory Visit: Payer: Self-pay | Admitting: *Deleted

## 2022-07-25 ENCOUNTER — Encounter (HOSPITAL_COMMUNITY): Payer: Self-pay | Admitting: Cardiology

## 2022-07-25 DIAGNOSIS — N179 Acute kidney failure, unspecified: Secondary | ICD-10-CM

## 2022-07-25 DIAGNOSIS — R7989 Other specified abnormal findings of blood chemistry: Secondary | ICD-10-CM

## 2022-07-25 NOTE — Telephone Encounter (Signed)
Message from Dr Irish LackFraser Din, we will plan to add ELiquis 5 mg BID. I called him and left a message on voicemail.

## 2022-07-26 NOTE — Telephone Encounter (Signed)
Left message to call office

## 2022-07-26 NOTE — Telephone Encounter (Signed)
Dr Irish Lack left message yesterday for patient to call office.  I placed call to patient but received message that call could not be completed at this time.

## 2022-07-27 ENCOUNTER — Other Ambulatory Visit: Payer: Self-pay | Admitting: Thoracic Surgery (Cardiothoracic Vascular Surgery)

## 2022-07-27 DIAGNOSIS — I25119 Atherosclerotic heart disease of native coronary artery with unspecified angina pectoris: Secondary | ICD-10-CM

## 2022-07-28 ENCOUNTER — Telehealth (HOSPITAL_COMMUNITY): Payer: Self-pay

## 2022-07-28 ENCOUNTER — Ambulatory Visit: Payer: Commercial Managed Care - PPO

## 2022-07-28 NOTE — Telephone Encounter (Signed)
Called to confirm Heart & Vascular Transitions of Care appointment at 08/01/22.   Call could not be completed.

## 2022-07-28 NOTE — Telephone Encounter (Signed)
Left message to call office

## 2022-08-01 ENCOUNTER — Ambulatory Visit (HOSPITAL_COMMUNITY)
Admit: 2022-08-01 | Discharge: 2022-08-01 | Disposition: A | Discharge status: Home / Self Care | Payer: Commercial Managed Care - PPO | Attending: Adult Health | Admitting: Adult Health

## 2022-08-01 ENCOUNTER — Encounter (HOSPITAL_COMMUNITY): Payer: Self-pay

## 2022-08-01 VITALS — BP 162/110 | HR 71 | Wt 258.2 lb

## 2022-08-01 DIAGNOSIS — I083 Combined rheumatic disorders of mitral, aortic and tricuspid valves: Secondary | ICD-10-CM | POA: Diagnosis not present

## 2022-08-01 DIAGNOSIS — I10 Essential (primary) hypertension: Secondary | ICD-10-CM

## 2022-08-01 DIAGNOSIS — I110 Hypertensive heart disease with heart failure: Secondary | ICD-10-CM | POA: Diagnosis not present

## 2022-08-01 DIAGNOSIS — G4733 Obstructive sleep apnea (adult) (pediatric): Secondary | ICD-10-CM | POA: Insufficient documentation

## 2022-08-01 DIAGNOSIS — I5022 Chronic systolic (congestive) heart failure: Secondary | ICD-10-CM | POA: Diagnosis present

## 2022-08-01 DIAGNOSIS — Z79899 Other long term (current) drug therapy: Secondary | ICD-10-CM | POA: Diagnosis not present

## 2022-08-01 DIAGNOSIS — I471 Supraventricular tachycardia, unspecified: Secondary | ICD-10-CM | POA: Insufficient documentation

## 2022-08-01 DIAGNOSIS — I059 Rheumatic mitral valve disease, unspecified: Secondary | ICD-10-CM

## 2022-08-01 LAB — BASIC METABOLIC PANEL
Anion gap: 6 (ref 5–15)
BUN: 13 mg/dL (ref 8–23)
CO2: 23 mmol/L (ref 22–32)
Calcium: 9.2 mg/dL (ref 8.9–10.3)
Chloride: 107 mmol/L (ref 98–111)
Creatinine, Ser: 1.23 mg/dL (ref 0.61–1.24)
GFR, Estimated: >60 mL/min (ref >60)
Glucose, Bld: 108 mg/dL — ABNORMAL HIGH (ref 70–99)
Potassium: 4.1 mmol/L (ref 3.5–5.1)
Sodium: 136 mmol/L (ref 135–145)

## 2022-08-01 MED ORDER — ENTRESTO 49-51 MG PO TABS: 1.0000 | ORAL_TABLET | Freq: Two times a day (BID) | ORAL | 5 refills | Status: DC

## 2022-08-01 MED ORDER — EMPAGLIFLOZIN 10 MG PO TABS: 10.0000 mg | ORAL_TABLET | Freq: Every day | ORAL | 11 refills | Status: DC

## 2022-08-01 NOTE — Patient Instructions (Signed)
Labs done today. We will contact you only if your labs are abnormal.  START Jardiance 10mg  (1 tablet) by mouth daily.  INCREASE Entresto to 49-51mg  (1 tablet) by mouth 2 times daily.   No other medication changes were made. Please continue all current medications as prescribed.  Your physician recommends that you schedule a follow-up appointment in: 1 week for a lab only appointment and in 2-3 weeks with [place] again.   If you have any questions or concerns before your next appointment please send [place] a message through [place] or call our office at [phone].    TO LEAVE A MESSAGE FOR THE NURSE SELECT OPTION 2, PLEASE LEAVE A MESSAGE INCLUDING: YOUR NAME DATE OF BIRTH CALL BACK NUMBER REASON FOR CALL**this is important as we prioritize the call backs  YOU WILL RECEIVE A CALL BACK THE SAME DAY AS LONG AS YOU CALL BEFORE 4:00 PM   Do the following things EVERYDAY: Weigh yourself in the morning before breakfast. Write it down and keep it in a log. Take your medicines as prescribed Eat low salt foods--Limit salt (sodium) to 2000 mg per day.  Stay as active as you can everyday Limit all fluids for the day to less than 2 liters   At the [facility], you and your health needs are our priority. As part of our continuing mission to provide you with exceptional heart care, we have created designated Provider Care Teams. These Care Teams include your primary Cardiologist (physician) and Advanced Practice Providers (APPs- Physician Assistants and Nurse Practitioners) who all work together to provide you with the care you need, when you need it.   You may see any of the following providers on your designated Care Team at your next follow up: Dr Glori Bickers Dr Haynes Kerns, NP Lyda Jester, [place] Audry Riles, PharmD   Please be sure to bring in all your medications bottles to every appointment.

## 2022-08-01 NOTE — Progress Notes (Signed)
HEART & VASCULAR TRANSITION OF CARE CONSULT NOTE     Referring Physician: Dr Nechama Guard  Primary Care: Dr Ayesha Rumpf  Primary Cardiologist: Dr Scarlette Calico EP : Dr Myles Gip  CT: Dr Lavonna Monarch   HPI: Referred to clinic by Dr Nechama Guard  for heart failure consultation.   Angel Lyons is a 62 year old with a history of SVT, Angel mod-severe, OSA and chronic HFrEF.  Presented with increased shortness of breath. Admitted with SVT possibly A flutter, rate 200s. Failed adenosine. Suspected A flutter. Underwent successful cardioversion. EP consulted will consider ablation. No plan for amiodarone given possible ablation down the road. Echo showed EF 45% with prolapse posterior leaflet.TEE showed prolapse of posterior MV leaflet and moderate to severe Angel. TEE confirmed prolapse of the P2 segment of the posterior mitral leaflet with severe Angel. CT surgery consulted with possible mitral valve surgery. Plan to optimize GDMT for several weeks. He has follow up  with CT surgery /EP.   Overall feeling fine. Denies SOB/PND/Orthopnea. Denies dizziness. Walking at home. Appetite ok. No fever or chills. Weight at home  253  pounds. Taking all medications. Drives full time as buse driver. Lives with his wife.   Cardiac Testing  RHC/LHC 07/22/22 Minimal coronary disease. mild pulm hypertension with PAP-mean 2 49/9 -9 mmHg.  PCWP 19 mmHg with a large V wave to 33 mmHg LVEDP 17 mmHg.  Echo 07/2022  1. Prolapse of posterior MV leaflet best seen on apical 4 chamber view  with moderate to severe Angel; suggest TEE to further assess.   2. Left ventricular ejection fraction, by estimation, is 40 to 45%. The  left ventricle has mildly decreased function. The left ventricle  demonstrates global hypokinesis. There is moderate left ventricular  hypertrophy. Left ventricular diastolic  parameters are consistent with Grade II diastolic dysfunction  (pseudonormalization).   3. Right ventricular systolic function is moderately reduced. The  right  ventricular size is moderately enlarged. There is mildly elevated  pulmonary artery systolic pressure.   4. Left atrial size was moderately dilated.   5. Right atrial size was moderately dilated.   6. The mitral valve is normal in structure. Moderate to severe mitral  valve regurgitation. No evidence of mitral stenosis. 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   7. The aortic valve is tricuspid. Aortic valve regurgitation is not  visualized. Aortic valve sclerosis/calcification is present, without any  evidence of aortic stenosis.   8. The inferior vena cava is dilated in size with <50% respiratory  variability, suggesting right atrial pressure of 15 mmHg.   TEE 07/2022  1. The left ventricle has low normal function.   2. Right ventricular systolic function is mildly reduced. The right  ventricular size is normal.   3. No left atrial/left atrial appendage thrombus was detected.   4. Prolapes of teh P2 segment of the posterior mitral leaflet with  noncoaptation in this region. Angel is directed more anterior into LA with  some splaying of the regurgitant signal.. There is a small amount of back  flow into R upper pulmonary vein  consistent with severe Angel (at SBP of 93 mm Hg).   5. Tricuspid valve regurgitation is mild to moderate.   6. The aortic valve is tricuspid. Aortic valve regurgitation is trivial.  Aortic valve sclerosis is present, with no evidence of aortic valve  stenosis.  Review of Systems: [y] = yes, [ ]  = no   General: Weight gain [ ] ;  Weight loss [ ] ; Anorexia [ ] ; Fatigue [ ] ; Fever [ ] ; Chills [ ] ; Weakness [ ]   Cardiac: Chest pain/pressure [ ] ; Resting SOB [ ] ; Exertional SOB [ ] ; Orthopnea [ ] ; Pedal Edema [ ] ; Palpitations [ ] ; Syncope [ ] ; Presyncope [ ] ; Paroxysmal nocturnal dyspnea[ ]   Pulmonary: Cough [ ] ; Wheezing[ ] ; Hemoptysis[ ] ; Sputum [ ] ; Snoring [ ]   GI: Vomiting[ ] ; Dysphagia[ ] ; Melena[ ] ; Hematochezia [ ] ;  Heartburn[ ] ; Abdominal pain [ ] ; Constipation [ ] ; Diarrhea [ ] ; BRBPR [ ]   GU: Hematuria[ ] ; Dysuria [ ] ; Nocturia[ ]   Vascular: Pain in legs with walking [ ] ; Pain in feet with lying flat [ ] ; Non-healing sores [ ] ; Stroke [ ] ; TIA [ ] ; Slurred speech [ ] ;  Neuro: Headaches[ ] ; Vertigo[ ] ; Seizures[ ] ; Paresthesias[ ] ;Blurred vision [ ] ; Diplopia [ ] ; Vision changes [ ]   Ortho/Skin: Arthritis [ ] ; Joint pain [ ] ; Muscle pain [ ] ; Joint swelling [ ] ; Back Pain [ Y]; Rash [ ]   Psych: Depression[ ] ; Anxiety[ ]   Heme: Bleeding problems [ ] ; Clotting disorders [ ] ; Anemia [ ]   Endocrine: Diabetes [ Y]; Thyroid dysfunction[ ]    Past Medical History:  Diagnosis Date   Abnormal heart rhythm    HTN (hypertension)    Sleep apnea     Current Outpatient Medications  Medication Sig Dispense Refill   aspirin EC 81 MG tablet Take 1 tablet (81 mg total) by mouth daily. Swallow whole. 30 tablet 12   metoprolol succinate (TOPROL-XL) 100 MG 24 hr tablet Take 1 tablet (100 mg total) by mouth daily. Take with or immediately following a meal. 30 tablet 2   nitroGLYCERIN (NITROSTAT) 0.4 MG SL tablet Place 1 tablet (0.4 mg total) under the tongue every 5 (five) minutes x 3 doses as needed for up to 60 doses for chest pain. 30 tablet 1   sacubitril-valsartan (ENTRESTO) 24-26 MG Take 1 tablet by mouth 2 (two) times daily. 60 tablet 2   No current facility-administered medications for this encounter.    No Known Allergies    Social History   Socioeconomic History   Marital status: Single    Spouse name: Not on file   Number of children: 4   Years of education: Not on file   Highest education level: Associate degree: academic program  Occupational History   Occupation:  Tobacco Use   Smoking status: Never   Smokeless tobacco: Never  Vaping Use   Vaping Use: Never used  Substance and Sexual Activity   Alcohol use: No   Drug use: No   Sexual activity: Not on file  Other  Topics Concern   Not on file  Social History Narrative   Not on file   Social Determinants of Health   Financial Resource Strain: Low Risk  (07/22/2022)   Overall Financial Resource Strain (CARDIA)    Difficulty of Paying Living Expenses: Not hard at all  Food Insecurity: No Food Insecurity (07/22/2022)   Hunger Vital Sign    Worried About Running Out of Food in the Last Year: Never true    Ran Out of Food in the Last Year: Never true  Transportation Needs: No Transportation Needs (07/22/2022)   PRAPARE - (Medical): No    Lack of Transportation (Non-Medical): No  Physical Activity: Not on file  Stress: Not on file  Social Connections: Not on file  Intimate Partner Violence:  Not on file      Family History  Problem Relation Age of Onset   Hypertension Father     Vitals:   08/01/22 0953  BP: (!) 162/110  Pulse: 71  SpO2: 97%  Weight: 117.1 kg (258 lb 3.2 oz)   Wt Readings from Last 3 Encounters:  08/01/22 117.1 kg (258 lb 3.2 oz)  07/22/22 116.9 kg (257 lb 11.2 oz)  01/20/17 99.8 kg (220 lb)    PHYSICAL EXAM: General:  Walked in the clinic.  No respiratory difficulty HEENT: normal Neck: supple. no JVD. Carotids 2+ bilat; no bruits. No lymphadenopathy or thryomegaly appreciated. Cor: PMI nondisplaced. Regular rate & rhythm. No rubs, gallops or murmurs. Lungs: clear Abdomen: soft, nontender, nondistended. No hepatosplenomegaly. No bruits or masses. Good bowel sounds. Extremities: no cyanosis, clubbing, rash, edema Neuro: alert & oriented x 3, cranial nerves grossly intact. moves all 4 extremities w/o difficulty. Affect pleasant.  ECG: SR 70 personally checked.    ASSESSMENT & PLAN: 1. HFmEF Echo 07/2022 EF 40-45% with RV mildly reduced.  NYHA II. Appears euvolemic  GDMT  Diuretic-Does not need loop diuretics.  BB- Continue current dose of Toprol  Ace/ARB/ARNI- Increase entresto 49-51 mg twice a day.  MRA- Consider next  visit.  SGLT2i- Add jardiance 10 mg daily - Discussed purpose.  - Check BMET   2. SVT EKG --SR today  -Continue Toprol -EP f/u 09/12/22 to discuss ablation.   3. Mitral Valve Regurgitation  TEE - prolapse of posterior MV leaflet and moderate to severe Angel. TEE confirmed prolapse of the P2 segment of the posterior mitral leaflet with severe Angel.  CT surgery consulted during recent hospitalization.  - Plan for follow up in a few weeks.   4. HTN Uncontrolled.  See above. Increasing entresto.     Referred to HFSW (PCP, Medications, Transportation, ETOH Abuse, Drug Abuse, Insurance, Financial ): Yes or No Refer to Pharmacy:  No Refer to Home Health: No Refer to Advanced Heart Failure Clinic:NO  Refer to General Cardiology: Dr Scarlette Calico   Follow up  2 weeks to reassess and optimize GDMT. Consider Arlyce Harman next visit. Letter provided for work.   Angel Weier NP-C   10:31 AM

## 2022-08-04 NOTE — Telephone Encounter (Signed)
Left message to call office

## 2022-08-05 ENCOUNTER — Encounter (HOSPITAL_COMMUNITY): Payer: Self-pay | Admitting: Thoracic Surgery (Cardiothoracic Vascular Surgery)

## 2022-08-08 ENCOUNTER — Ambulatory Visit (HOSPITAL_COMMUNITY)
Admission: RE | Admit: 2022-08-08 | Discharge: 2022-08-08 | Disposition: A | Discharge status: Home / Self Care | Payer: Commercial Managed Care - PPO | Source: Ambulatory Visit | Attending: Cardiology | Admitting: Cardiology

## 2022-08-08 DIAGNOSIS — I5022 Chronic systolic (congestive) heart failure: Secondary | ICD-10-CM | POA: Diagnosis present

## 2022-08-08 LAB — BASIC METABOLIC PANEL
Anion gap: 8 (ref 5–15)
BUN: 17 mg/dL (ref 8–23)
CO2: 21 mmol/L — ABNORMAL LOW (ref 22–32)
Calcium: 9.2 mg/dL (ref 8.9–10.3)
Chloride: 108 mmol/L (ref 98–111)
Creatinine, Ser: 1.3 mg/dL — ABNORMAL HIGH (ref 0.61–1.24)
GFR, Estimated: >60 mL/min (ref >60)
Glucose, Bld: 98 mg/dL (ref 70–99)
Potassium: 3.9 mmol/L (ref 3.5–5.1)
Sodium: 137 mmol/L (ref 135–145)

## 2022-08-08 NOTE — Telephone Encounter (Signed)
Call placed to patient.  Message received that call could not be completed at this time.

## 2022-08-09 ENCOUNTER — Ambulatory Visit: Payer: Commercial Managed Care - PPO | Admitting: Cardiovascular Disease

## 2022-08-09 ENCOUNTER — Encounter (HOSPITAL_COMMUNITY): Payer: Self-pay | Admitting: Internal Medicine

## 2022-08-15 ENCOUNTER — Ambulatory Visit: Payer: Commercial Managed Care - PPO | Admitting: Thoracic Surgery (Cardiothoracic Vascular Surgery)

## 2022-08-16 ENCOUNTER — Telehealth (HOSPITAL_COMMUNITY): Payer: Self-pay

## 2022-08-16 NOTE — Telephone Encounter (Signed)
Called to confirm Heart & Vascular Transitions of Care appointment at 08/17/22. Patient reminded to bring all medications and pill box organizer with them. Confirmed patient has transportation. Gave directions, instructed to utilize [facility] parking.  Confirmed appointment prior to ending call.

## 2022-08-17 ENCOUNTER — Ambulatory Visit: Payer: Commercial Managed Care - PPO | Admitting: Thoracic Surgery (Cardiothoracic Vascular Surgery)

## 2022-08-17 ENCOUNTER — Ambulatory Visit (HOSPITAL_COMMUNITY)
Admission: RE | Admit: 2022-08-17 | Discharge: 2022-08-17 | Disposition: A | Discharge status: Home / Self Care | Payer: Commercial Managed Care - PPO | Source: Ambulatory Visit | Attending: Adult Health | Admitting: Adult Health

## 2022-08-17 ENCOUNTER — Encounter (HOSPITAL_COMMUNITY): Payer: Self-pay

## 2022-08-17 VITALS — BP 144/96 | HR 67 | Ht 72.0 in | Wt 256.2 lb

## 2022-08-17 DIAGNOSIS — Z7984 Long term (current) use of oral hypoglycemic drugs: Secondary | ICD-10-CM | POA: Diagnosis not present

## 2022-08-17 DIAGNOSIS — I110 Hypertensive heart disease with heart failure: Secondary | ICD-10-CM | POA: Insufficient documentation

## 2022-08-17 DIAGNOSIS — Z8679 Personal history of other diseases of the circulatory system: Secondary | ICD-10-CM | POA: Insufficient documentation

## 2022-08-17 DIAGNOSIS — Z79899 Other long term (current) drug therapy: Secondary | ICD-10-CM | POA: Insufficient documentation

## 2022-08-17 DIAGNOSIS — I10 Essential (primary) hypertension: Secondary | ICD-10-CM

## 2022-08-17 DIAGNOSIS — I4892 Unspecified atrial flutter: Secondary | ICD-10-CM | POA: Diagnosis not present

## 2022-08-17 DIAGNOSIS — I083 Combined rheumatic disorders of mitral, aortic and tricuspid valves: Secondary | ICD-10-CM | POA: Diagnosis not present

## 2022-08-17 DIAGNOSIS — I2510 Atherosclerotic heart disease of native coronary artery without angina pectoris: Secondary | ICD-10-CM | POA: Diagnosis not present

## 2022-08-17 DIAGNOSIS — I340 Nonrheumatic mitral (valve) insufficiency: Secondary | ICD-10-CM

## 2022-08-17 DIAGNOSIS — G4733 Obstructive sleep apnea (adult) (pediatric): Secondary | ICD-10-CM | POA: Diagnosis not present

## 2022-08-17 DIAGNOSIS — I272 Pulmonary hypertension, unspecified: Secondary | ICD-10-CM | POA: Insufficient documentation

## 2022-08-17 DIAGNOSIS — I5022 Chronic systolic (congestive) heart failure: Secondary | ICD-10-CM

## 2022-08-17 DIAGNOSIS — Z7982 Long term (current) use of aspirin: Secondary | ICD-10-CM | POA: Diagnosis not present

## 2022-08-17 LAB — BASIC METABOLIC PANEL
Anion gap: 8 (ref 5–15)
BUN: 16 mg/dL (ref 8–23)
CO2: 21 mmol/L — ABNORMAL LOW (ref 22–32)
Calcium: 9.2 mg/dL (ref 8.9–10.3)
Chloride: 109 mmol/L (ref 98–111)
Creatinine, Ser: 1.26 mg/dL — ABNORMAL HIGH (ref 0.61–1.24)
GFR, Estimated: >60 mL/min (ref >60)
Glucose, Bld: 100 mg/dL — ABNORMAL HIGH (ref 70–99)
Potassium: 4.1 mmol/L (ref 3.5–5.1)
Sodium: 138 mmol/L (ref 135–145)

## 2022-08-17 MED ORDER — SPIRONOLACTONE 25 MG PO TABS: 12.5000 mg | ORAL_TABLET | Freq: Every day | ORAL | 3 refills | Status: DC

## 2022-08-17 MED ORDER — APIXABAN 5 MG PO TABS: 5.0000 mg | ORAL_TABLET | Freq: Two times a day (BID) | ORAL | 3 refills | Status: DC

## 2022-08-17 NOTE — Patient Instructions (Signed)
Start Eliqiuis 5 mg twice daily - Rx sent to local pharmacy. (30 day sample provided) Start Spiro 12.5 mg (1/2 tab) daily - Rx sent to local pharmacy. Return to see Amy Clegg in [facility] on 08/29/22 at 2:00 pm.

## 2022-08-17 NOTE — Progress Notes (Signed)
Provided patient with Eliquis samples as follows:  SN AC [id number] Exp 9/25 #56 tablets

## 2022-08-17 NOTE — Progress Notes (Signed)
HEART IMPACT TRANSITIONS OF CARE    PCP: Dr Ayesha Rumpf Primary Cardiologist: Dr Lilia Argue  EP: Dr Myles Gip CT Surgery: Dr Lavonna Monarch   HPI: Angel Lyons is a 62 year old with a history of SVT, A flutter, HTN, Angel mod-severe, OSA and chronic HFrEF.  Admitted 07/20/22 with SVT possibly A flutter, rate 200s. Failed adenosine. Suspected A flutter. Underwent successful cardioversion. EP consulted will consider ablation. No plan for amiodarone given possible ablation down the road. Echo showed EF 45% with prolapse posterior leaflet.TEE showed prolapse of posterior MV leaflet and moderate to severe Angel. TEE confirmed prolapse of the P2 segment of the posterior mitral leaflet with severe Angel. CT surgery consulted with possible mitral valve surgery. Plan to optimize GDMT for several weeks. He has follow up  with CT surgery /EP.    He was seen in the HF [facility] clinic 08/01/22. Entresto was increased to 49-51 mg twice a day and jardiance was started.   Today he returns for HF follow up.Overall feeling fine. Denies SOB/PND/Orthopnea. Walks on the treadmill 65 minutes 4 days a week + a few weights. Appetite ok. No fever or chills. Weight at home 249-252 pounds. Taking all medications. Drives full time as bus driver. Lives with his girlfriend and son.    Cardiac Testing  RHC/LHC 07/22/22 Minimal coronary disease. mild pulm hypertension with PAP-mean 2 49/9 -9 mmHg.  PCWP 19 mmHg with a large V wave to 33 mmHg LVEDP 17 mmHg.   Echo 07/2022  1. Prolapse of posterior MV leaflet best seen on apical 4 chamber view  with moderate to severe Angel; suggest TEE to further assess.   2. Left ventricular ejection fraction, by estimation, is 40 to 45%. The  left ventricle has mildly decreased function. The left ventricle  demonstrates global hypokinesis. There is moderate left ventricular  hypertrophy. Left ventricular diastolic  parameters are consistent with Grade II diastolic dysfunction  (pseudonormalization).   3. Right  ventricular systolic function is moderately reduced. The right  ventricular size is moderately enlarged. There is mildly elevated  pulmonary artery systolic pressure.   4. Left atrial size was moderately dilated.   5. Right atrial size was moderately dilated.   6. The mitral valve is normal in structure. Moderate to severe mitral  valve regurgitation. No evidence of mitral stenosis. 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   7. The aortic valve is tricuspid. Aortic valve regurgitation is not  visualized. Aortic valve sclerosis/calcification is present, without any  evidence of aortic stenosis.   8. The inferior vena cava is dilated in size with <50% respiratory  variability, suggesting right atrial pressure of 15 mmHg.    TEE 07/2022  1. The left ventricle has low normal function.   2. Right ventricular systolic function is mildly reduced. The right  ventricular size is normal.   3. No left atrial/left atrial appendage thrombus was detected.   4. Prolapes of teh P2 segment of the posterior mitral leaflet with  noncoaptation in this region. Angel is directed more anterior into LA with  some splaying of the regurgitant signal.. There is a small amount of back  flow into R upper pulmonary vein  consistent with severe Angel (at SBP of 93 mm Hg).   5. Tricuspid valve regurgitation is mild to moderate.   6. The aortic valve is tricuspid. Aortic valve regurgitation is trivial.  Aortic valve sclerosis is present, with no evidence of aortic valve  stenosis.  ROS: All systems negative except as listed in HPI, PMH and Problem List.  SH:  Social History   Socioeconomic History   Marital status: Single    Spouse name: Not on file   Number of children: 4   Years of education: Not on file   Highest education level: Associate degree: academic program  Occupational History   Occupation: Proofreader  Tobacco Use   Smoking status: Never    Smokeless tobacco: Never  Vaping Use   Vaping Use: Never used  Substance and Sexual Activity   Alcohol use: No   Drug use: No   Sexual activity: Not on file  Other Topics Concern   Not on file  Social History Narrative   Not on file   Social Determinants of Health   Financial Resource Strain: Low Risk  (07/22/2022)   Overall Financial Resource Strain (CARDIA)    Difficulty of Paying Living Expenses: Not hard at all  Food Insecurity: No Food Insecurity (07/22/2022)   Hunger Vital Sign    Worried About Running Out of Food in the Last Year: Never true    [place] in the Last Year: Never true  Transportation Needs: No Transportation Needs (07/22/2022)   PRAPARE - Hydrologist (Medical): No    Lack of Transportation (Non-Medical): No  Physical Activity: Not on file  Stress: Not on file  Social Connections: Not on file  Intimate Partner Violence: Not on file    FH:  Family History  Problem Relation Age of Onset   Hypertension Father     Past Medical History:  Diagnosis Date   Abnormal heart rhythm    HTN (hypertension)    Sleep apnea     Current Outpatient Medications  Medication Sig Dispense Refill   aspirin EC 81 MG tablet Take 1 tablet (81 mg total) by mouth daily. Swallow whole. 30 tablet 12   empagliflozin (JARDIANCE) 10 MG TABS tablet Take 1 tablet (10 mg total) by mouth daily before breakfast. 30 tablet 11   metoprolol succinate (TOPROL-XL) 100 MG 24 hr tablet Take 1 tablet (100 mg total) by mouth daily. Take with or immediately following a meal. 30 tablet 2   sacubitril-valsartan (ENTRESTO) 49-51 MG Take 1 tablet by mouth 2 (two) times daily. 60 tablet 5   nitroGLYCERIN (NITROSTAT) 0.4 MG SL tablet Place 1 tablet (0.4 mg total) under the tongue every 5 (five) minutes x 3 doses as needed for up to 60 doses for chest pain. (Patient not taking: Reported on 08/17/2022) 30 tablet 1   No current facility-administered medications for this  encounter.    Vitals:   08/17/22 1127 08/17/22 1128  BP: (!) 160/100 (!) 144/96  Pulse: 67   SpO2: 95%   Weight: 116.2 kg (256 lb 3.2 oz)   Height: 6' (1.829 m)    Wt Readings from Last 3 Encounters:  08/17/22 116.2 kg (256 lb 3.2 oz)  08/01/22 117.1 kg (258 lb 3.2 oz)  07/22/22 116.9 kg (257 lb 11.2 oz)    PHYSICAL EXAM: General:  Well appearing. No resp difficulty. Walked in the clinic.  HEENT: normal Neck: supple. JVP flat. Carotids 2+ bilaterally; no bruits. No lymphadenopathy or thryomegaly appreciated. Cor: PMI normal. Regular rate & rhythm. No rubs, gallops or murmurs. Lungs: clear Abdomen: soft, nontender, nondistended. No hepatosplenomegaly. No bruits or masses. Good bowel sounds. Extremities: no cyanosis, clubbing, rash, edema Neuro: alert & orientedx3, cranial nerves grossly intact. Moves all 4  extremities w/o difficulty. Affect pleasant.   ASSESSMENT & PLAN: 1. HFmEF Echo 07/2022 EF 40-45% with RV mildly reduced.  NYHA II. Appears euvolemic  GDMT  Diuretic-Does not need loop diuretics.  BB- Continue current dose of Toprol  Ace/ARB/ARNI- Continue entresto 49-51 mg twice a day.  MRA- Add 12.5 mg spironolactone daily.  SGLT2i- Continue jardiance 10 mg daily  - Check BMET    2. A flutter -Dr Scarlette Calico discussed with EP recommendations to start eliquis. I will start today.   -Continue Toprol -EP f/u 09/12/22 to discuss possible ablation.    3. Mitral Valve Regurgitation  TEE - prolapse of posterior MV leaflet and moderate to severe Angel. TEE confirmed prolapse of the P2 segment of the posterior mitral leaflet with severe Angel.  -CT surgery consulted during recent hospitalization. Plan for follow up with Dr Lavonna Monarch the end of the month.    4. HTN Needs additional control.  Continue current medications and add spironolactone.   5. OSA Needs sleep study     Follow up  in 2 weeks to for a final visit.    Angel Axtman NP-C  4:47 PM

## 2022-08-17 NOTE — Telephone Encounter (Signed)
Patient saw Darrick Grinder, NP today and medication started at this visit

## 2022-08-19 ENCOUNTER — Ambulatory Visit (HOSPITAL_COMMUNITY): Payer: Commercial Managed Care - PPO | Attending: Cardiology

## 2022-08-19 DIAGNOSIS — I25119 Atherosclerotic heart disease of native coronary artery with unspecified angina pectoris: Secondary | ICD-10-CM | POA: Diagnosis present

## 2022-08-19 LAB — ECHOCARDIOGRAM COMPLETE
Area-P 1/2: 3.91 cm2
Calc EF: 61.5 %
Est EF: 55
MV M vel: 5.55 m/s
MV Peak grad: 123.2 mmHg
Radius: 0.4 cm
S' Lateral: 3.6 cm
Single Plane A2C EF: 57.9 %
Single Plane A4C EF: 65.4 %

## 2022-08-26 ENCOUNTER — Telehealth (HOSPITAL_COMMUNITY): Payer: Self-pay

## 2022-08-26 NOTE — Telephone Encounter (Signed)
Attempted to reach patient to confirm appointment on 08/29/22. No answer and no voice mail

## 2022-08-29 ENCOUNTER — Ambulatory Visit: Payer: Commercial Managed Care - PPO | Admitting: Thoracic Surgery (Cardiothoracic Vascular Surgery)

## 2022-08-29 ENCOUNTER — Encounter: Payer: Self-pay | Admitting: Thoracic Surgery (Cardiothoracic Vascular Surgery)

## 2022-08-29 ENCOUNTER — Encounter (HOSPITAL_COMMUNITY): Payer: Self-pay

## 2022-08-29 ENCOUNTER — Ambulatory Visit (HOSPITAL_COMMUNITY)
Admission: RE | Admit: 2022-08-29 | Discharge: 2022-08-29 | Disposition: A | Discharge status: Home / Self Care | Payer: Commercial Managed Care - PPO | Source: Ambulatory Visit | Attending: Adult Health | Admitting: Adult Health

## 2022-08-29 VITALS — BP 140/77 | HR 54 | Resp 20 | Ht 72.0 in | Wt 256.0 lb

## 2022-08-29 VITALS — BP 132/82 | HR 53 | Wt 258.0 lb

## 2022-08-29 DIAGNOSIS — I5020 Unspecified systolic (congestive) heart failure: Secondary | ICD-10-CM

## 2022-08-29 DIAGNOSIS — Z8679 Personal history of other diseases of the circulatory system: Secondary | ICD-10-CM | POA: Insufficient documentation

## 2022-08-29 DIAGNOSIS — Z79899 Other long term (current) drug therapy: Secondary | ICD-10-CM | POA: Diagnosis not present

## 2022-08-29 DIAGNOSIS — G4733 Obstructive sleep apnea (adult) (pediatric): Secondary | ICD-10-CM | POA: Diagnosis not present

## 2022-08-29 DIAGNOSIS — I059 Rheumatic mitral valve disease, unspecified: Secondary | ICD-10-CM | POA: Diagnosis not present

## 2022-08-29 DIAGNOSIS — I480 Paroxysmal atrial fibrillation: Secondary | ICD-10-CM | POA: Insufficient documentation

## 2022-08-29 DIAGNOSIS — Z7984 Long term (current) use of oral hypoglycemic drugs: Secondary | ICD-10-CM | POA: Diagnosis not present

## 2022-08-29 DIAGNOSIS — I38 Endocarditis, valve unspecified: Secondary | ICD-10-CM | POA: Diagnosis not present

## 2022-08-29 DIAGNOSIS — I4892 Unspecified atrial flutter: Secondary | ICD-10-CM

## 2022-08-29 DIAGNOSIS — R0602 Shortness of breath: Secondary | ICD-10-CM | POA: Diagnosis present

## 2022-08-29 DIAGNOSIS — I110 Hypertensive heart disease with heart failure: Secondary | ICD-10-CM | POA: Diagnosis not present

## 2022-08-29 DIAGNOSIS — I10 Essential (primary) hypertension: Secondary | ICD-10-CM

## 2022-08-29 DIAGNOSIS — I340 Nonrheumatic mitral (valve) insufficiency: Secondary | ICD-10-CM

## 2022-08-29 LAB — BASIC METABOLIC PANEL
Anion gap: 8 (ref 5–15)
BUN: 15 mg/dL (ref 8–23)
CO2: 24 mmol/L (ref 22–32)
Calcium: 9.3 mg/dL (ref 8.9–10.3)
Chloride: 107 mmol/L (ref 98–111)
Creatinine, Ser: 1.36 mg/dL — ABNORMAL HIGH (ref 0.61–1.24)
GFR, Estimated: 58 mL/min — ABNORMAL LOW (ref >60)
Glucose, Bld: 125 mg/dL — ABNORMAL HIGH (ref 70–99)
Potassium: 4.1 mmol/L (ref 3.5–5.1)
Sodium: 139 mmol/L (ref 135–145)

## 2022-08-29 LAB — CBC
HCT: 41.8 % (ref 39.0–52.0)
Hemoglobin: 14 g/dL (ref 13.0–17.0)
MCH: 29.4 pg (ref 26.0–34.0)
MCHC: 33.5 g/dL (ref 30.0–36.0)
MCV: 87.6 fL (ref 80.0–100.0)
Platelets: 238 10*3/uL (ref 150–400)
RBC: 4.77 MIL/uL (ref 4.22–5.81)
RDW: 12.7 % (ref 11.5–15.5)
WBC: 7.2 10*3/uL (ref 4.0–10.5)
nRBC: 0 % (ref 0.0–0.2)

## 2022-08-29 MED ORDER — SPIRONOLACTONE 25 MG PO TABS: 25.0000 mg | ORAL_TABLET | Freq: Every day | ORAL | 3 refills | Status: DC

## 2022-08-29 NOTE — Patient Instructions (Signed)
EP evaluation for ablation Continue medical management Assess for Mini mitral valve repair following EP recomendations

## 2022-08-29 NOTE — Progress Notes (Signed)
[address]       [place] [address]             [phone]           Carliss T Darr Monona Medical Record [id number] Date of Birth: [birth year]  Jettie Booze, MD Lin Landsman, MD  Chief Complaint:Hx of MR and SVT     History of Present Illness:     Pt known to me from hospital consult after admission for SVT. Pt with depressed LV function and with severe MR and TR from prolapse P2. Pt with no PHTN and no CAD. Pt treated with cardioversion and heart failure medication. Repeat TTE earlier this month with resolved LV function, trivial Tr and now moderate MR. Pt without symptoms since hospitalization. Seeing EP in a few weeks to discuss possible ablation.      Past Medical History:  Diagnosis Date   Abnormal heart rhythm    HTN (hypertension)    Sleep apnea     Past Surgical History:  Procedure Laterality Date   NO PAST SURGERIES     RIGHT/LEFT HEART CATH AND CORONARY ANGIOGRAPHY N/A 07/22/2022   Procedure: RIGHT/LEFT HEART CATH AND CORONARY ANGIOGRAPHY;  Surgeon: Leonie Man, MD;  Location: [facility];  Service: Cardiovascular;  Laterality: N/A;   TEE WITHOUT CARDIOVERSION N/A 07/21/2022   Procedure: TRANSESOPHAGEAL ECHOCARDIOGRAM (TEE);  Surgeon: Fay Records, MD;  Location: [facility] ENDOSCOPY;  Service: Cardiovascular;  Laterality: N/A;    Social History   Tobacco Use  Smoking Status Never  Smokeless Tobacco Never    Social History   Substance and Sexual Activity  Alcohol Use No    Social History   Socioeconomic History   Marital status: Single    Spouse name: Not on file   Number of children: 4   Years of education: Not on file   Highest education level: Associate degree: academic program  Occupational History   Occupation: Proofreader  Tobacco Use   Smoking status: Never   Smokeless tobacco: Never  Vaping Use   Vaping Use: Never used  Substance and Sexual Activity   Alcohol use: No   Drug use: No    Sexual activity: Not on file  Other Topics Concern   Not on file  Social History Narrative   Not on file   Social Determinants of Health   Financial Resource Strain: Low Risk  (07/22/2022)   Overall Financial Resource Strain (CARDIA)    Difficulty of Paying Living Expenses: Not hard at all  Food Insecurity: No Food Insecurity (07/22/2022)   Hunger Vital Sign    Worried About Running Out of Food in the Last Year: Never true    [place] in the Last Year: Never true  Transportation Needs: No Transportation Needs (07/22/2022)   PRAPARE - Hydrologist (Medical): No    Lack of Transportation (Non-Medical): No  Physical Activity: Not on file  Stress: Not on file  Social Connections: Not on file  Intimate Partner Violence: Not on file    No Known Allergies  Current Outpatient Medications  Medication Sig Dispense Refill   apixaban (ELIQUIS) 5 MG TABS tablet Take 1 tablet (5 mg total) by mouth 2 (two) times daily. 60 tablet 3   aspirin EC 81 MG tablet Take 1 tablet (81 mg total) by mouth daily. Swallow whole. 30 tablet 12   empagliflozin (JARDIANCE) 10 MG TABS tablet Take  1 tablet (10 mg total) by mouth daily before breakfast. 30 tablet 11   metoprolol succinate (TOPROL-XL) 100 MG 24 hr tablet Take 1 tablet (100 mg total) by mouth daily. Take with or immediately following a meal. 30 tablet 2   nitroGLYCERIN (NITROSTAT) 0.4 MG SL tablet Place 1 tablet (0.4 mg total) under the tongue every 5 (five) minutes x 3 doses as needed for up to 60 doses for chest pain. (Patient not taking: Reported on 08/17/2022) 30 tablet 1   sacubitril-valsartan (ENTRESTO) 49-51 MG Take 1 tablet by mouth 2 (two) times daily. 60 tablet 5   spironolactone (ALDACTONE) 25 MG tablet Take 0.5 tablets (12.5 mg total) by mouth daily. 45 tablet 3   No current facility-administered medications for this visit.     Family History  Problem Relation Age of Onset   Hypertension Father         Physical Exam: Neuro: no focal deficits Card: RR without murmur Ext: no edema Lungs Clear     Diagnostic Studies & Laboratory data: I have personally reviewed the following studies and agree with the findings   TEE (07/2022) IMPRESSIONS     1. The left ventricle has low normal function.   2. Right ventricular systolic function is mildly reduced. The right  ventricular size is normal.   3. No left atrial/left atrial appendage thrombus was detected.   4. Prolapes of teh P2 segment of the posterior mitral leaflet with  noncoaptation in this region. MR is directed more anterior into LA with  some splaying of the regurgitant signal.. There is a small amount of back  flow into R upper pulmonary vein  consistent with severe MR (at SBP of 93 mm Hg).   5. Tricuspid valve regurgitation is mild to moderate.   6. The aortic valve is tricuspid. Aortic valve regurgitation is trivial.  Aortic valve sclerosis is present, with no evidence of aortic valve  stenosis.   FINDINGS   Left Ventricle: The left ventricle has low normal function. The left  ventricular internal cavity size was normal in size.   Right Ventricle: The right ventricular size is normal. Right ventricular  systolic function is mildly reduced.   Left Atrium: Left atrial size was normal in size. No left atrial/left  atrial appendage thrombus was detected.   Pericardium: There is no evidence of pericardial effusion.   Mitral Valve: Prolapes of teh P2 segment of the posterior mitral leaflet  with noncoaptation in this region. MR is directed more anterior into LA  with some splaying of the regurgitant signal.. There is a small amount of  back flow into R upper pulmonary  vein consistent with severe MR (at SBP of 93 mm Hg).   Tricuspid Valve: The tricuspid valve is normal in structure. Tricuspid  valve regurgitation is mild to moderate.   Aortic Valve: The aortic valve is tricuspid. Aortic valve regurgitation is   trivial. Aortic valve sclerosis is present, with no evidence of aortic  valve stenosis.   Pulmonic Valve: The pulmonic valve was grossly normal. Pulmonic valve  regurgitation is trivial.   Aorta: The aortic root is normal in size and structure. There is minimal  (Grade I) plaque.   Additional Comments: Spectral Doppler performed.   MR Peak grad: 111.1 mmHg  MR Mean grad: 77.0 mmHg  MR Vmax:      527.00 cm/s  MR Vmean:     424.0 cm/s   CATH (07/2022) Conclusion      Mid RCA lesion  is 10% stenosed.   Dist RCA lesion is 15% stenosed.   LV end diastolic pressure is mildly elevated.   Hemodynamic findings consistent with mild pulmonary hypertension.   There is no aortic valve stenosis.   POST-OPERATIVE DIAGNOSIS:   Angiographically minimal Mild pulm hypertension with PAP-mean 2 49/9 -9 mmHg.  PCWP 19 mmHg with a large V wave to 33 mmHg LVEDP 17 mmHg.   TTE (08/2022) IMPRESSIONS     1. Left ventricular ejection fraction, by estimation, is 55%. The left  ventricle has normal function. The left ventricle has no regional wall  motion abnormalities. Left ventricular diastolic parameters are consistent  with Grade II diastolic dysfunction  (pseudonormalization). The average left ventricular global longitudinal  strain is -21.8 %. The global longitudinal strain is normal.   2. Right ventricular systolic function is normal. The right ventricular  size is normal. There is normal pulmonary artery systolic pressure. The  estimated right ventricular systolic pressure is 99991111 mmHg.   3. Left atrial size was mildly dilated.   4. Right atrial size was mildly dilated.   5. The mitral valve is abnormal. There appears to be mild prolapse of the  posterior leaflet, no definite flail segment visualized. Moderate  eccentric, anteriorly-directed mitral valve regurgitation but there is  splay artifact noted so possible that MR  could be worse. No evidence of mitral stenosis.   6. The aortic  valve is tricuspid. There is mild calcification of the  aortic valve. Aortic valve regurgitation is not visualized.   7. Aortic dilatation noted. There is mild dilatation of the ascending  aorta, measuring 40 mm.   8. The inferior vena cava is normal in size with greater than 50%  respiratory variability, suggesting right atrial pressure of 3 mmHg.   FINDINGS   Left Ventricle: Left ventricular ejection fraction, by estimation, is  55%. The left ventricle has normal function. The left ventricle has no  regional wall motion abnormalities. The average left ventricular global  longitudinal strain is -21.8 %. The  global longitudinal strain is normal. The left ventricular internal cavity  size was normal in size. There is no left ventricular hypertrophy. Left  ventricular diastolic parameters are consistent with Grade II diastolic  dysfunction (pseudonormalization).   Right Ventricle: The right ventricular size is normal. No increase in  right ventricular wall thickness. Right ventricular systolic function is  normal. There is normal pulmonary artery systolic pressure. The tricuspid  regurgitant velocity is 2.34 m/s, and   with an assumed right atrial pressure of 3 mmHg, the estimated right  ventricular systolic pressure is 99991111 mmHg.   Left Atrium: Left atrial size was mildly dilated.   Right Atrium: Right atrial size was mildly dilated.   Pericardium: There is no evidence of pericardial effusion.   Mitral Valve: The mitral valve is abnormal. Moderate mitral valve  regurgitation. No evidence of mitral valve stenosis.   Tricuspid Valve: The tricuspid valve is normal in structure. Tricuspid  valve regurgitation is trivial.   Aortic Valve: The aortic valve is tricuspid. There is mild calcification  of the aortic valve. Aortic valve regurgitation is not visualized.   Pulmonic Valve: The pulmonic valve was normal in structure. Pulmonic valve  regurgitation is trivial.   Aorta: The  aortic root is normal in size and structure and aortic  dilatation noted. There is mild dilatation of the ascending aorta,  measuring 40 mm.   Venous: The inferior vena cava is normal in size with greater than 50%  respiratory variability, suggesting right atrial pressure of 3 mmHg.   IAS/Shunts: No atrial level shunt detected by color flow Doppler.     LEFT VENTRICLE  PLAX 2D  LVIDd:         5.90 cm      Diastology  LVIDs:         3.60 cm      LV e' medial:    5.33 cm/s  LV PW:         0.90 cm      LV E/e' medial:  14.6  LV IVS:        0.90 cm      LV e' lateral:   12.60 cm/s  LVOT diam:     2.20 cm      LV E/e' lateral: 6.2  LV SV:         81  LV SV Index:   34           2D Longitudinal Strain  LVOT Area:     3.80 cm     2D Strain GLS Avg:     -21.8 %    LV Volumes (MOD)  LV vol d, MOD A2C: 202.0 ml 3D Volume EF:  LV vol d, MOD A4C: 165.0 ml 3D EF:        56 %  LV vol s, MOD A2C: 85.0 ml  LV EDV:       206 ml  LV vol s, MOD A4C: 57.1 ml  LV ESV:       90 ml  LV SV MOD A2C:     117.0 ml LV SV:        116 ml  LV SV MOD A4C:     165.0 ml  LV SV MOD BP:      113.9 ml   RIGHT VENTRICLE  RV S prime:     13.60 cm/s  TAPSE (M-mode): 2.2 cm   LEFT ATRIUM             Index        RIGHT ATRIUM           Index  LA diam:        4.40 cm 1.86 cm/m   RA Area:     22.10 cm  LA Vol (A2C):   62.3 ml 26.32 ml/m  RA Volume:   62.60 ml  26.45 ml/m  LA Vol (A4C):   70.1 ml 29.62 ml/m  LA Biplane Vol: 65.8 ml 27.80 ml/m   AORTIC VALVE  LVOT Vmax:   101.00 cm/s  LVOT Vmean:  61.800 cm/s  LVOT VTI:    0.213 m    AORTA  Ao Root diam: 3.20 cm  Ao Asc diam:  4.00 cm   MITRAL VALVE                  TRICUSPID VALVE  MV Area (PHT): 3.91 cm       TR Peak grad:   21.9 mmHg  MV Decel Time: 194 msec       TR Vmax:        234.00 cm/s  MR Peak grad:    123.2 mmHg  MR Mean grad:    77.0 mmHg    SHUNTS  MR Vmax:         555.00 cm/s  Systemic VTI:  0.21 m  MR Vmean:        420.0 cm/s    Systemic Diam: 2.20 cm  MR PISA:  1.01 cm  MR PISA Eff ROA: 7 mm  MR PISA Radius:  0.40 cm  MV E velocity: 77.60 cm/s  MV A velocity: 67.70 cm/s  MV E/A ratio:  1.15    Recent Radiology Findings:       Recent Lab Findings: Lab Results  Component Value Date   WBC 9.3 07/22/2022   HGB 12.2 (L) 07/22/2022   HCT 36.0 (L) 07/22/2022   PLT 213 07/22/2022   GLUCOSE 100 (H) 08/17/2022   CHOL 170 07/21/2022   TRIG 62 07/21/2022   HDL 36 (L) 07/21/2022   LDLCALC 122 (H) 07/21/2022   ALT 54 (H) 07/22/2022   AST 40 07/22/2022   NA 138 08/17/2022   K 4.1 08/17/2022   CL 109 08/17/2022   CREATININE 1.26 (H) 08/17/2022   BUN 16 08/17/2022   CO2 21 (L) 08/17/2022   TSH 2.380 07/20/2022   HGBA1C 6.2 (H) 07/21/2022      Assessment / Plan:     62 yo male with NYHA class I symptoms of moderate to severe MR from posterior leaflet prolapse and with improved LV function on medical management. Will await EP plans for ablation and reassses for mini mitral repair after. Pt will most likely need surgery on mitral valve but he is hoping to be able to continue with medical management.   I have spent 40 min in review of the records, viewing studies and in face to face with patient and in coordination of future care    Coralie Common 08/29/2022 9:13 AM

## 2022-08-29 NOTE — Progress Notes (Signed)
HEART IMPACT TRANSITIONS OF CARE    PCP: Dr Ayesha Rumpf  Primary Cardiologist: Dr Scarlette Calico  CT surgery: Dr Lavonna Monarch.  EP: Dr Myles Gip   HPI: Mr Angel Lyons is a 62 year old with a history of SVT, A flutter, HTN, MR mod-severe, OSA,  and chronic HFrEF.   Admitted 07/20/22 with SVT possibly A flutter, rate 200s. Failed adenosine. Suspected A flutter. Underwent successful cardioversion. EP consulted will consider ablation. No plan for amiodarone given possible ablation down the road. Echo showed EF 45% with prolapse posterior leaflet.TEE showed prolapse of posterior MV leaflet and moderate to severe MR. TEE confirmed prolapse of the P2 segment of the posterior mitral leaflet with severe MR. CT surgery consulted with possible mitral valve surgery. Plan to optimize GDMT for several weeks. He has follow up  with CT surgery /EP.    He was seen in the HF [facility] clinic 08/01/22. Entresto was increased to 49-51 mg twice a day and jardiance was started. Had HF TOC f/u 08/17/22. Started on spironolactone 12.5 mg. Also started on eliquis for PAF.    Today he returns for HF follow up. Last visit spironolactone started. Overall feeling fine.  Gets a little short of breath walking  on the treadmill for 65 minutes!Angel Lyons on treadmill 4 times a week.  Denies PND/Orthopnea. No bleeding issues. BP at home 130s/80s. Appetite ok. No fever or chills. Weight at home 251 pounds. Taking all medications. 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 Lives with his girlfriend and son.    Cardiac Testing  RHC/LHC 07/22/22 Minimal coronary disease. mild pulm hypertension with PAP-mean 2 49/9 -9 mmHg.  PCWP 19 mmHg with a large V wave to 33 mmHg LVEDP 17 mmHg.   Echo 07/2022  1. Prolapse of posterior MV leaflet best seen on apical 4 chamber view  with moderate to severe MR; suggest TEE to further assess.   2. Left ventricular ejection fraction, by estimation, is 40 to 45%. The  left ventricle has mildly decreased function. The left ventricle   demonstrates global hypokinesis. There is moderate left ventricular  hypertrophy. Left ventricular diastolic  parameters are consistent with Grade II diastolic dysfunction  (pseudonormalization).   3. Right ventricular systolic function is moderately reduced. The right  ventricular size is moderately enlarged. There is mildly elevated  pulmonary artery systolic pressure.   4. Left atrial size was moderately dilated.   5. Right atrial size was moderately dilated.   6. The mitral valve is normal in structure. Moderate to severe mitral  valve regurgitation. No evidence of mitral stenosis. 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   7. The aortic valve is tricuspid. Aortic valve regurgitation is not  visualized. Aortic valve sclerosis/calcification is present, without any  evidence of aortic stenosis.   8. The inferior vena cava is dilated in size with <50% respiratory  variability, suggesting right atrial pressure of 15 mmHg.    TEE 07/2022  1. The left ventricle has low normal function.   2. Right ventricular systolic function is mildly reduced. The right  ventricular size is normal.   3. No left atrial/left atrial appendage thrombus was detected.   4. Prolapes of teh P2 segment of the posterior mitral leaflet with  noncoaptation in this region. MR is directed more anterior into LA with  some splaying of the regurgitant signal.. There is a small amount of back  flow into R upper pulmonary vein  consistent with severe MR (  at SBP of 93 mm Hg).   5. Tricuspid valve regurgitation is mild to moderate.   6. The aortic valve is tricuspid. Aortic valve regurgitation is trivial.  Aortic valve sclerosis is present, with no evidence of aortic valve  stenosis.      ROS: All systems negative except as listed in HPI, PMH and Problem List.  SH:  Social History   Socioeconomic History   Marital status: Single    Spouse name: Not on file   Number of  children: 4   Years of education: Not on file   Highest education level: Associate degree: academic program  Occupational History   Occupation: Proofreader  Tobacco Use   Smoking status: Never   Smokeless tobacco: Never  Vaping Use   Vaping Use: Never used  Substance and Sexual Activity   Alcohol use: No   Drug use: No   Sexual activity: Not on file  Other Topics Concern   Not on file  Social History Narrative   Not on file   Social Determinants of Health   Financial Resource Strain: Low Risk  (07/22/2022)   Overall Financial Resource Strain (CARDIA)    Difficulty of Paying Living Expenses: Not hard at all  Food Insecurity: No Food Insecurity (07/22/2022)   Hunger Vital Sign    Worried About Running Out of Food in the Last Year: Never true    [place] in the Last Year: Never true  Transportation Needs: No Transportation Needs (07/22/2022)   PRAPARE - Hydrologist (Medical): No    Lack of Transportation (Non-Medical): No  Physical Activity: Not on file  Stress: Not on file  Social Connections: Not on file  Intimate Partner Violence: Not on file    FH:  Family History  Problem Relation Age of Onset   Hypertension Father     Past Medical History:  Diagnosis Date   Abnormal heart rhythm    HTN (hypertension)    Sleep apnea     Current Outpatient Medications  Medication Sig Dispense Refill   apixaban (ELIQUIS) 5 MG TABS tablet Take 1 tablet (5 mg total) by mouth 2 (two) times daily. 60 tablet 3   empagliflozin (JARDIANCE) 10 MG TABS tablet Take 1 tablet (10 mg total) by mouth daily before breakfast. 30 tablet 11   metoprolol succinate (TOPROL-XL) 100 MG 24 hr tablet Take 1 tablet (100 mg total) by mouth daily. Take with or immediately following a meal. 30 tablet 2   sacubitril-valsartan (ENTRESTO) 49-51 MG Take 1 tablet by mouth 2 (two) times daily. 60 tablet 5   spironolactone (ALDACTONE) 25 MG tablet Take 0.5 tablets (12.5  mg total) by mouth daily. 45 tablet 3   nitroGLYCERIN (NITROSTAT) 0.4 MG SL tablet Place 1 tablet (0.4 mg total) under the tongue every 5 (five) minutes x 3 doses as needed for up to 60 doses for chest pain. (Patient not taking: Reported on 08/17/2022) 30 tablet 1   No current facility-administered medications for this encounter.    Vitals:   08/29/22 1405  BP: 132/82  Pulse: (!) 53  SpO2: 95%  Weight: 117 kg (258 lb)   Wt Readings from Last 3 Encounters:  08/29/22 117 kg (258 lb)  08/17/22 116.2 kg (256 lb 3.2 oz)  08/01/22 117.1 kg (258 lb 3.2 oz)    PHYSICAL EXAM: General:  Well appearing. No resp difficulty. Walked in the clinic.  HEENT: normal Neck: supple. JVP flat. Carotids 2+  bilaterally; no bruits. No lymphadenopathy or thryomegaly appreciated. Cor: PMI normal. Regular rate & rhythm. No rubs, gallops or murmurs. Lungs: clear Abdomen: soft, nontender, nondistended. No hepatosplenomegaly. No bruits or masses. Good bowel sounds. Extremities: no cyanosis, clubbing, rash, edema Neuro: alert & orientedx3, cranial nerves grossly intact. Moves all 4 extremities w/o difficulty. Affect pleasant.   ASSESSMENT & PLAN: 1. HFmEF Echo 07/2022 EF 40-45% with RV mildly reduced.  NYHA II. Appears euvolemic.  GDMT  Diuretic-Does not need loop diuretics.  BB- Continue current dose of Toprol . Heart rate 53 . No room for titration.  Ace/ARB/ARNI- Continue entresto 49-51 mg twice a day.  MRA- Increase spironolactone to 25 mg daily.   SGLT2i- Continue jardiance 10 mg daily  - Check BMET     2. A flutter -Dr Scarlette Calico discussed with EP. Eliquis recommended. On  08/17/22 I started him on eliquis 5 mg twice a day.   - Continue Toprol -EP f/u 09/12/22 to discuss possible ablation.  - Check CBC.    3. Mitral Valve Regurgitation  TEE - prolapse of posterior MV leaflet and moderate to severe MR. TEE confirmed prolapse of the P2 segment of the posterior mitral leaflet with severe MR.  -CT  surgery consulted during recent hospitalization. Plan for follow up with Dr Lavonna Monarch later today.     4. HTN Better controlled. Stable.   5. OSA Needs sleep study   Increase spironolactone 25 mg daily.  Check CBC and BMET     Follow up as needed. Return to Dr Scarlette Calico.     Analucia Hush NP-C   2:21 PM

## 2022-08-29 NOTE — Patient Instructions (Signed)
INCREASE Spironolactone to 25 mg one tab daily   Labs today We will only contact you if something comes back abnormal or we need to make some changes. Otherwise no news is good news!  It was a pleasure to see you today and all cardiology follow up will be with Dr Scarlette Calico -they will be in contact with an appointment    Do the following things EVERYDAY: Weigh yourself in the morning before breakfast. Write it down and keep it in a log. Take your medicines as prescribed Eat low salt foods--Limit salt (sodium) to 2000 mg per day.  Stay as active as you can everyday Limit all fluids for the day to less than 2 liters

## 2022-09-12 ENCOUNTER — Encounter: Payer: Self-pay | Admitting: Cardiovascular Disease

## 2022-09-12 ENCOUNTER — Ambulatory Visit: Payer: Commercial Managed Care - PPO | Attending: Cardiovascular Disease | Admitting: Cardiovascular Disease

## 2022-09-12 VITALS — BP 138/90 | HR 55 | Ht 72.0 in | Wt 253.8 lb

## 2022-09-12 DIAGNOSIS — I4892 Unspecified atrial flutter: Secondary | ICD-10-CM | POA: Diagnosis not present

## 2022-09-12 DIAGNOSIS — I471 Supraventricular tachycardia, unspecified: Secondary | ICD-10-CM | POA: Diagnosis not present

## 2022-09-12 NOTE — Patient Instructions (Addendum)
Medication Instructions:  Your physician recommends that you continue on your current medications as directed. Please refer to the Current Medication list given to you today.  *If you need a refill on your cardiac medications before your next appointment, please call your pharmacy*  Lab Work: None ordered.  If you have labs (blood work) drawn today and your tests are completely normal, you will receive your results only by: [place] (if you have MyChart) OR A paper copy in the mail If you have any lab test that is abnormal or we need to change your treatment, we will call you to review the results.  Testing/Procedures: None ordered.  Follow-Up: Dr. Doralee Albino has ordered an Atrial Flutter Ablation with Anesthesia, Carto and Ice.  A scheduler will reach out to you to schedule this procedure in the near future.       Provider:   Doralee Albino, MD{or one of the following Advanced Practice Providers on your designated Care Team:   Tommye Standard, [place] Legrand Como "Jonni Sanger" Chalmers Cater, [place]  Cardiac Ablation Cardiac ablation is a procedure to destroy, or ablate, a small amount of heart tissue that is causing problems. The heart has many electrical connections. Sometimes, these connections are abnormal and can cause the heart to beat very fast or irregularly. Ablating the abnormal areas can improve the heart's rhythm or return it to normal. Ablation may be done for people who: Have irregular or rapid heartbeats (arrhythmias). Have Wolff-Parkinson-White syndrome. Have taken medicines for an arrhythmia that did not work or caused side effects. Have a high-risk heartbeat that may be life-threatening. Tell a health care provider about: Any allergies you have. All medicines you are taking, including vitamins, herbs, eye drops, creams, and over-the-counter medicines. Any problems you or family members have had with anesthesia. Any bleeding problems you have. Any surgeries you have had. Any  medical conditions you have. Whether you are pregnant or may be pregnant. What are the risks? Your health care provider will talk with you about risks. These may include: Infection. Bruising and bleeding. Stroke or blood clots. Damage to nearby structures or organs. Allergic reaction to medicines or dyes. Needing a pacemaker if the heart gets damaged. A pacemaker is a device that helps the heart beat normally. Failure of the procedure. A repeat procedure may be needed. What happens before the procedure? Medicines Ask your health care provider about: Changing or stopping your regular medicines. These include any heart rhythm medicines, diabetes medicines, or blood thinners you take. Taking medicines such as aspirin and ibuprofen. These medicines can thin your blood. Do not take them unless your health care provider tells you to. Taking over-the-counter medicines, vitamins, herbs, and supplements. General instructions Follow instructions from your health care provider about what you may eat and drink. If you will be going home right after the procedure, plan to have a responsible adult: Take you home from the hospital or clinic. You will not be allowed to drive. Care for you for the time you are told. Ask your health care provider what steps will be taken to prevent infection. What happens during the procedure?  An IV will be inserted into one of your veins. You may be given: A sedative. This helps you relax. Anesthesia. This will: Numb certain areas of your body. An incision will be made in your neck or your groin. A needle will be inserted through the incision and into a large vein in your neck or groin. The small, thin tube (catheter) will be  inserted through the needle and moved to your heart. A type of X-ray (fluoroscopy) will be used to help guide the catheter and provide images of the heart on a monitor. Dye may be injected through the catheter to help your surgeon see the area  of the heart that needs treatment. Electrical currents will be sent from the catheter to destroy heart tissue in certain areas. There are three types of energy that may be used to do this: Heat (radiofrequency energy). Laser energy. Extreme cold (cryoablation). When the tissue has been destroyed, the catheter will be removed. Pressure will be held on the insertion area to prevent bleeding. A bandage (dressing) will be placed over the insertion area. The procedure may vary among health care providers and hospitals. What happens after the procedure? Your blood pressure, heart rate and rhythm, breathing rate, and blood oxygen level will be monitored until you leave the hospital or clinic. Your insertion area will be checked for bleeding. You will need to lie still for a few hours. If your groin was used, you will need to keep your leg straight for a few hours after the catheter is removed. This information is not intended to replace advice given to you by your health care provider. Make sure you discuss any questions you have with your health care provider. Document Revised: 12/07/2021 Document Reviewed: 12/07/2021 Elsevier Patient Education  [place].

## 2022-09-12 NOTE — Addendum Note (Signed)
Addended by: Oleta Mouse C on: 09/12/2022 11:26 AM   Modules accepted: Orders

## 2022-09-12 NOTE — Progress Notes (Signed)
Electrophysiology Office Note:    Date:  09/12/2022   ID:  Angel Lyons, DOB Jun 11, 1961, MRN [id number]  PCP:  Lin Landsman, MD   [facility] Providers Cardiologist:  None     Referring MD: No ref. provider found   History of Present Illness:    Angel Lyons is a 62 y.o. male with a hx listed below, significant for CHFrEF, severe MR, referred for arrhythmia management.  He was admitted in January 2024 with cardiomyopathy and rapid SVT, possibly 1-1 atrial flutter.  EKG showed a narrow complex tachycardia at the rate of 240 bpm.  Adenosine did not affect the rhythm.  He underwent urgent DC cardioversion.  He was discharged on Eliquis and metoprolol.   He reports that he has been doing very well since the ablation. He has not had any palpitations, syncope, pre-syncope, chest pain.    Past Medical History:  Diagnosis Date   Abnormal heart rhythm    HTN (hypertension)    Sleep apnea     Past Surgical History:  Procedure Laterality Date   NO PAST SURGERIES     RIGHT/LEFT HEART CATH AND CORONARY ANGIOGRAPHY N/A 07/22/2022   Procedure: RIGHT/LEFT HEART CATH AND CORONARY ANGIOGRAPHY;  Surgeon: Leonie Man, MD;  Location: [facility];  Service: Cardiovascular;  Laterality: N/A;   TEE WITHOUT CARDIOVERSION N/A 07/21/2022   Procedure: TRANSESOPHAGEAL ECHOCARDIOGRAM (TEE);  Surgeon: Fay Records, MD;  Location: [facility] ENDOSCOPY;  Service: Cardiovascular;  Laterality: N/A;    Current Medications: Current Meds  Medication Sig   apixaban (ELIQUIS) 5 MG TABS tablet Take 1 tablet (5 mg total) by mouth 2 (two) times daily.   empagliflozin (JARDIANCE) 10 MG TABS tablet Take 1 tablet (10 mg total) by mouth daily before breakfast.   metoprolol succinate (TOPROL-XL) 100 MG 24 hr tablet Take 1 tablet (100 mg total) by mouth daily. Take with or immediately following a meal.   nitroGLYCERIN (NITROSTAT) 0.4 MG SL tablet Place 1 tablet (0.4 mg total) under the tongue every 5  (five) minutes x 3 doses as needed for up to 60 doses for chest pain.   sacubitril-valsartan (ENTRESTO) 49-51 MG Take 1 tablet by mouth 2 (two) times daily.   spironolactone (ALDACTONE) 25 MG tablet Take 1 tablet (25 mg total) by mouth daily.     Allergies:   Patient has no known allergies.   Social and Family History: Reviewed in Epic  ROS:   Please see the history of present illness.    All other systems reviewed and are negative.  EKGs/Labs/Other Studies Reviewed Today:    Cardiac Studies & Procedures   CARDIAC CATHETERIZATION  CARDIAC CATHETERIZATION 07/23/2022  Narrative   Mid RCA lesion is 10% stenosed.   Dist RCA lesion is 15% stenosed.   LV end diastolic pressure is mildly elevated.   Hemodynamic findings consistent with mild pulmonary hypertension.   There is no aortic valve stenosis.  POST-OPERATIVE DIAGNOSIS: Angiographically minimal Mild pulm hypertension with PAP-mean 2 49/9 -9 mmHg.  PCWP 19 mmHg with a large V wave to 33 mmHg LVEDP 17 mmHg.   PLAN OF CARE:  Return to RN unit for ongoing care -> consider CVTS consult for MVR/MAZE & EP for Flutter ablation    Glenetta Hew, MD  Findings Coronary Findings Diagnostic  Dominance: Right  Left Main Vessel was injected. Vessel is normal in caliber. Vessel is angiographically normal.  Left Anterior Descending Vessel was injected. Vessel is normal in caliber. Vessel  is angiographically normal.  First Diagonal Branch Vessel was injected. Vessel is small in size. Vessel is angiographically normal.  Ramus Intermedius Vessel was injected. Vessel is small. Vessel is angiographically normal.  Left Circumflex Vessel was injected. Vessel is normal in caliber. Vessel is angiographically normal.  First Obtuse Marginal Branch Vessel was injected. Vessel is small in size. Vessel is angiographically normal.  Left Posterior Descending Artery Vessel was injected. Vessel is small in size. Vessel is angiographically  normal.  Right Coronary Artery Vessel was injected. Vessel is large. Vessel is angiographically normal. Mid RCA lesion is 10% stenosed. Dist RCA lesion is 15% stenosed.  Acute Marginal Branch Vessel was injected. Vessel is small in size. Vessel is angiographically normal.  Right Posterior Atrioventricular Artery Vessel was injected. Vessel is moderate in size. Vessel is angiographically normal.  First Right Posterolateral Branch Vessel was injected. Vessel is small in size. Vessel is angiographically normal.  Second Right Posterolateral Branch Vessel was injected. Vessel is moderate in size. Vessel is angiographically normal.  Intervention  No interventions have been documented.     ECHOCARDIOGRAM  ECHOCARDIOGRAM COMPLETE 08/19/2022  Narrative ECHOCARDIOGRAM REPORT    Patient Name:   Angel Lyons [facility] Date of Exam: 08/19/2022 Medical Rec #:  [id number]        Height:       72.0 in Accession #:    [id number]       Weight:       256.2 lb Date of Birth:  1960-12-04        BSA:          2.367 m Patient Age:    29 years         BP:           144/96 mmHg Patient Gender: M                HR:           57 bpm. Exam Location:  [place]  Procedure: 2D Echo, Cardiac Doppler, Color Doppler, 3D Echo and Strain Analysis  Indications:    CAD Native Vessel I25.10  History:        Patient has prior history of Echocardiogram examinations, most recent 07/21/2022. Risk Factors:Hypertension and Sleep Apnea.  Sonographer:    Bernadene Person RDCS Referring Phys: [id number] Coralie Common   Sonographer Comments: Global longitudinal strain was attempted. IMPRESSIONS   1. Left ventricular ejection fraction, by estimation, is 55%. The left ventricle has normal function. The left ventricle has no regional wall motion abnormalities. Left ventricular diastolic parameters are consistent with Grade II diastolic dysfunction (pseudonormalization). The average left ventricular global longitudinal  strain is -21.8 %. The global longitudinal strain is normal. 2. Right ventricular systolic function is normal. The right ventricular size is normal. There is normal pulmonary artery systolic pressure. The estimated right ventricular systolic pressure is [id number] mmHg. 3. Left atrial size was mildly dilated. 4. Right atrial size was mildly dilated. 5. The mitral valve is abnormal. There appears to be mild prolapse of the posterior leaflet, no definite flail segment visualized. Moderate eccentric, anteriorly-directed mitral valve regurgitation but there is splay artifact noted so possible that MR could be worse. No evidence of mitral stenosis. 6. The aortic valve is tricuspid. There is mild calcification of the aortic valve. Aortic valve regurgitation is not visualized. 7. Aortic dilatation noted. There is mild dilatation of the ascending aorta, measuring 40 mm. 8. The inferior vena cava is normal in size with greater than 50%  respiratory variability, suggesting right atrial pressure of 3 mmHg.  FINDINGS Left Ventricle: Left ventricular ejection fraction, by estimation, is 55%. The left ventricle has normal function. The left ventricle has no regional wall motion abnormalities. The average left ventricular global longitudinal strain is -21.8 %. The global longitudinal strain is normal. The left ventricular internal cavity size was normal in size. There is no left ventricular hypertrophy. Left ventricular diastolic parameters are consistent with Grade II diastolic dysfunction (pseudonormalization).  Right Ventricle: The right ventricular size is normal. No increase in right ventricular wall thickness. Right ventricular systolic function is normal. There is normal pulmonary artery systolic pressure. The tricuspid regurgitant velocity is 2.34 m/s, and with an assumed right atrial pressure of 3 mmHg, the estimated right ventricular systolic pressure is 99991111 mmHg.  Left Atrium: Left atrial size was mildly  dilated.  Right Atrium: Right atrial size was mildly dilated.  Pericardium: There is no evidence of pericardial effusion.  Mitral Valve: The mitral valve is abnormal. Moderate mitral valve regurgitation. No evidence of mitral valve stenosis.  Tricuspid Valve: The tricuspid valve is normal in structure. Tricuspid valve regurgitation is trivial.  Aortic Valve: The aortic valve is tricuspid. There is mild calcification of the aortic valve. Aortic valve regurgitation is not visualized.  Pulmonic Valve: The pulmonic valve was normal in structure. Pulmonic valve regurgitation is trivial.  Aorta: The aortic root is normal in size and structure and aortic dilatation noted. There is mild dilatation of the ascending aorta, measuring 40 mm.  Venous: The inferior vena cava is normal in size with greater than 50% respiratory variability, suggesting right atrial pressure of 3 mmHg.  IAS/Shunts: No atrial level shunt detected by color flow Doppler.   LEFT VENTRICLE PLAX 2D LVIDd:         5.90 cm      Diastology LVIDs:         3.60 cm      LV e' medial:    5.33 cm/s LV PW:         0.90 cm      LV E/e' medial:  14.6 LV IVS:        0.90 cm      LV e' lateral:   12.60 cm/s LVOT diam:     2.20 cm      LV E/e' lateral: 6.2 LV SV:         81 LV SV Index:   34           2D Longitudinal Strain LVOT Area:     3.80 cm     2D Strain GLS Avg:     -21.8 %  LV Volumes (MOD) LV vol d, MOD A2C: 202.0 ml 3D Volume EF: LV vol d, MOD A4C: 165.0 ml 3D EF:        56 % LV vol s, MOD A2C: 85.0 ml  LV EDV:       206 ml LV vol s, MOD A4C: 57.1 ml  LV ESV:       90 ml LV SV MOD A2C:     117.0 ml LV SV:        116 ml LV SV MOD A4C:     165.0 ml LV SV MOD BP:      113.9 ml  RIGHT VENTRICLE RV S prime:     13.60 cm/s TAPSE (M-mode): 2.2 cm  LEFT ATRIUM             Index  RIGHT ATRIUM           Index LA diam:        4.40 cm 1.86 cm/m   RA Area:     22.10 cm LA Vol (A2C):   62.3 ml 26.32 ml/m  RA Volume:    62.60 ml  26.45 ml/m LA Vol (A4C):   70.1 ml 29.62 ml/m LA Biplane Vol: 65.8 ml 27.80 ml/m AORTIC VALVE LVOT Vmax:   101.00 cm/s LVOT Vmean:  61.800 cm/s LVOT VTI:    0.213 m  AORTA Ao Root diam: 3.20 cm Ao Asc diam:  4.00 cm  MITRAL VALVE                  TRICUSPID VALVE MV Area (PHT): 3.91 cm       TR Peak grad:   21.9 mmHg MV Decel Time: 194 msec       TR Vmax:        234.00 cm/s MR Peak grad:    123.2 mmHg MR Mean grad:    77.0 mmHg    SHUNTS MR Vmax:         555.00 cm/s  Systemic VTI:  0.21 m MR Vmean:        420.0 cm/s   Systemic Diam: 2.20 cm MR PISA:         1.01 cm MR PISA Eff ROA: 7 mm MR PISA Radius:  0.40 cm MV E velocity: 77.60 cm/s MV A velocity: 67.70 cm/s MV E/A ratio:  1.15  Dalton McleanMD Electronically signed by Franki Monte Signature Date/Time: 08/19/2022/1:43:05 PM    Final   TEE  ECHO TEE 07/21/2022  Narrative TRANSESOPHOGEAL ECHO REPORT    Patient Name:   Angel Lyons Date of Exam: 07/21/2022 Medical Rec #:  [id number]        Height:       76.0 in Accession #:    [id number]       Weight:       220.0 lb Date of Birth:  05/03/1961        BSA:          2.307 m Patient Age:    42 years         BP:           100/65 mmHg Patient Gender: M                HR:           82 bpm. Exam Location:  Inpatient  Procedure: Transesophageal Echo, 3D Echo, Color Doppler and Cardiac Doppler  Indications:     Mitral Regurgitation  History:         Patient has prior history of Echocardiogram examinations, most recent 07/20/2022. Signs/Symptoms:Shortness of Breath; Risk Factors:Sleep Apnea and Hypertension.  Sonographer:     Bernadene Person RDCS Referring Phys:  Dorris Carnes, V Diagnosing Phys: Dorris Carnes MD  PROCEDURE: After discussion of the risks and benefits of a TEE, an informed consent was obtained from the patient. The transesophogeal probe was passed without difficulty through the esophogus of the patient. Local oropharyngeal anesthetic was  provided with viscous lidocaine. Sedation performed by different physician. The patient was monitored while under deep sedation. Anesthestetic sedation was provided intravenously by Anesthesiology: 538.'22mg'$  of Propofol. The patient's vital signs; including heart rate, blood pressure, and oxygen saturation; remained stable throughout the procedure. The patient developed no complications during the procedure.  IMPRESSIONS   1. The left ventricle has low normal  function. 2. Right ventricular systolic function is mildly reduced. The right ventricular size is normal. 3. No left atrial/left atrial appendage thrombus was detected. 4. Prolapes of teh P2 segment of the posterior mitral leaflet with noncoaptation in this region. MR is directed more anterior into LA with some splaying of the regurgitant signal.. There is a small amount of back flow into R upper pulmonary vein consistent with severe MR (at SBP of 93 mm Hg). 5. Tricuspid valve regurgitation is mild to moderate. 6. The aortic valve is tricuspid. Aortic valve regurgitation is trivial. Aortic valve sclerosis is present, with no evidence of aortic valve stenosis.  FINDINGS Left Ventricle: The left ventricle has low normal function. The left ventricular internal cavity size was normal in size.  Right Ventricle: The right ventricular size is normal. Right ventricular systolic function is mildly reduced.  Left Atrium: Left atrial size was normal in size. No left atrial/left atrial appendage thrombus was detected.  Pericardium: There is no evidence of pericardial effusion.  Mitral Valve: Prolapes of teh P2 segment of the posterior mitral leaflet with noncoaptation in this region. MR is directed more anterior into LA with some splaying of the regurgitant signal.. There is a small amount of back flow into R upper pulmonary vein consistent with severe MR (at SBP of 93 mm Hg).  Tricuspid Valve: The tricuspid valve is normal in structure. Tricuspid  valve regurgitation is mild to moderate.  Aortic Valve: The aortic valve is tricuspid. Aortic valve regurgitation is trivial. Aortic valve sclerosis is present, with no evidence of aortic valve stenosis.  Pulmonic Valve: The pulmonic valve was grossly normal. Pulmonic valve regurgitation is trivial.  Aorta: The aortic root is normal in size and structure. There is minimal (Grade I) plaque.  Additional Comments: Spectral Doppler performed.  MR Peak grad: 111.1 mmHg MR Mean grad: 77.0 mmHg MR Vmax:      527.00 cm/s MR Vmean:     424.0 cm/s  Dorris Carnes MD Electronically signed by Dorris Carnes MD Signature Date/Time: 07/21/2022/5:37:05 PM    Final            EKG:  Last EKG results: sinus bradycardia   Recent Labs: 07/20/2022: B Natriuretic Peptide 114.4; Magnesium 1.8; TSH 2.380 07/22/2022: ALT 54 08/29/2022: BUN 15; Creatinine, Ser 1.36; Hemoglobin 14.0; Platelets 238; Potassium 4.1; Sodium 139     Physical Exam:    VS:  BP (!) 138/90   Pulse (!) 55   Ht 6' (1.829 m)   Wt 253 lb 12.8 oz (115.1 kg)   SpO2 98%   BMI 34.42 kg/m     Wt Readings from Last 3 Encounters:  09/12/22 253 lb 12.8 oz (115.1 kg)  08/29/22 258 lb (117 kg)  08/29/22 256 lb (116.1 kg)     GEN: Well nourished, well developed in no acute distress CARDIAC: RRR, no murmurs, rubs, gallops RESPIRATORY:  Normal work of breathing MUSCULOSKELETAL: no edema    ASSESSMENT & PLAN:    SVT: possibly 1:1 flutter. Will schedule EP study and ablation. If we do not induce the arrhythmia or demonstrate substrate for SVT, we will plan for CTI line. Will use general anesthesia and perform an EP study with/without isoproterenol.   We discussed the indication, rationale, logistics, anticipated benefits, and potential risks of the ablation procedure including but not limited to -- bleed at the groin access site, chest pain, damage to nearby organs such as the diaphragm, lungs, or esophagus, need for a drainage tube, or  prolonged  hospitalization. I explained that the risk for stroke, heart attack, need for open chest surgery, or even death is very low but not zero. he  expressed understanding and wishes to proceed.         Medication Adjustments/Labs and Tests Ordered: Current medicines are reviewed at length with the patient today.  Concerns regarding medicines are outlined above.  Orders Placed This Encounter  Procedures   EKG 12-Lead   No orders of the defined types were placed in this encounter.        Signed, Melida Quitter, MD  09/12/2022 11:01 AM    [place]

## 2022-09-14 ENCOUNTER — Telehealth: Payer: Self-pay

## 2022-09-14 DIAGNOSIS — I4892 Unspecified atrial flutter: Secondary | ICD-10-CM

## 2022-09-14 NOTE — Telephone Encounter (Signed)
Pt is scheduled for 12/19/22...  Lab appt: 12/05/22...  Instruction letter will be sent via [facility].Marland KitchenMarland Kitchen

## 2022-10-18 ENCOUNTER — Other Ambulatory Visit: Payer: Self-pay | Admitting: Cardiology

## 2022-10-28 ENCOUNTER — Other Ambulatory Visit: Payer: Self-pay | Admitting: Cardiology

## 2022-12-05 ENCOUNTER — Ambulatory Visit: Payer: Commercial Managed Care - PPO | Attending: Cardiovascular Disease

## 2022-12-05 DIAGNOSIS — I4892 Unspecified atrial flutter: Secondary | ICD-10-CM

## 2022-12-06 LAB — CBC
Hematocrit: 43.1 % (ref 37.5–51.0)
Hemoglobin: 14.3 g/dL (ref 13.0–17.7)
MCH: 29 pg (ref 26.6–33.0)
MCHC: 33.2 g/dL (ref 31.5–35.7)
MCV: 87 fL (ref 79–97)
Platelets: 238 10*3/uL (ref 150–450)
RBC: 4.93 x10E6/uL (ref 4.14–5.80)
RDW: 13 % (ref 11.6–15.4)
WBC: 7.5 10*3/uL (ref 3.4–10.8)

## 2022-12-06 LAB — BASIC METABOLIC PANEL
BUN/Creatinine Ratio: 9 — ABNORMAL LOW (ref 10–24)
BUN: 13 mg/dL (ref 8–27)
CO2: 20 mmol/L (ref 20–29)
Calcium: 9.5 mg/dL (ref 8.6–10.2)
Chloride: 106 mmol/L (ref 96–106)
Creatinine, Ser: 1.39 mg/dL — ABNORMAL HIGH (ref 0.76–1.27)
Glucose: 97 mg/dL (ref 70–99)
Potassium: 4.3 mmol/L (ref 3.5–5.2)
Sodium: 139 mmol/L (ref 134–144)
eGFR: 57 mL/min/{1.73_m2} — ABNORMAL LOW (ref >59)

## 2022-12-11 NOTE — Pre-Procedure Instructions (Signed)
Patient scheduled for procedure with anesthesia on 12/19/22.  He takes [place] Pepper that will need to be held 72 hours before procedure.  No Jardiance on Friday 6/14, Saturday 6/15 and Sunday 6/16.  Don't take on day of procedure as well.

## 2022-12-16 NOTE — Pre-Procedure Instructions (Signed)
Instructed patient on the following items: Arrival time 5:15 Nothing to eat or drink after midnight No meds AM of procedure Responsible person to drive you home and stay with you for 24 hrs  Have you missed any doses of anti-coagulant Eliquis- takes twice a day, hasn't missed any doses.  Don't take dose on Monday morning.

## 2022-12-18 NOTE — Anesthesia Preprocedure Evaluation (Signed)
Anesthesia Evaluation  Patient identified by MRN, date of birth, ID band Patient awake    Reviewed: Allergy & Precautions, NPO status , Patient's Chart, lab work & pertinent test results  History of Anesthesia Complications Negative for: history of anesthetic complications  Airway Mallampati: III  TM Distance: >3 FB Neck ROM: Full    Dental no notable dental hx. (+) Dental Advisory Given   Pulmonary sleep apnea    Pulmonary exam normal        Cardiovascular hypertension, Pt. on medications Normal cardiovascular exam+ dysrhythmias Atrial Fibrillation + Valvular Problems/Murmurs MR   TEE 1/24 IMPRESSIONS     1. Left ventricular ejection fraction, by estimation, is 55%. The left  ventricle has normal function. The left ventricle has no regional wall  motion abnormalities. Left ventricular diastolic parameters are consistent  with Grade II diastolic dysfunction  (pseudonormalization). The average left ventricular global longitudinal  strain is -21.8 %. The global longitudinal strain is normal.   2. Right ventricular systolic function is normal. The right ventricular  size is normal. There is normal pulmonary artery systolic pressure. The  estimated right ventricular systolic pressure is 24.9 mmHg.   3. Left atrial size was mildly dilated.   4. Right atrial size was mildly dilated.   5. The mitral valve is abnormal. There appears to be mild prolapse of the  posterior leaflet, no definite flail segment visualized. Moderate  eccentric, anteriorly-directed mitral valve regurgitation but there is  splay artifact noted so possible that MR  could be worse. No evidence of mitral stenosis.   6. The aortic valve is tricuspid. There is mild calcification of the  aortic valve. Aortic valve regurgitation is not visualized.   7. Aortic dilatation noted. There is mild dilatation of the ascending  aorta, measuring 40 mm.   8. The inferior  vena cava is normal in size with greater than 50%  respiratory variability, suggesting right atrial pressure of 3 mmHg.      Neuro/Psych negative neurological ROS     GI/Hepatic negative GI ROS, Neg liver ROS,,,  Endo/Other  negative endocrine ROS    Renal/GU negative Renal ROS     Musculoskeletal negative musculoskeletal ROS (+)    Abdominal   Peds  Hematology negative hematology ROS (+)   Anesthesia Other Findings   Reproductive/Obstetrics                             Anesthesia Physical Anesthesia Plan  ASA: 2  Anesthesia Plan: General   Post-op Pain Management: Minimal or no pain anticipated   Induction: Intravenous  PONV Risk Score and Plan: 2 and Ondansetron and Midazolam  Airway Management Planned:   Additional Equipment:   Intra-op Plan:   Post-operative Plan:   Informed Consent: I have reviewed the patients History and Physical, chart, labs and discussed the procedure including the risks, benefits and alternatives for the proposed anesthesia with the patient or authorized representative who has indicated his/her understanding and acceptance.     Dental advisory given  Plan Discussed with: Anesthesiologist and CRNA  Anesthesia Plan Comments:        Anesthesia Quick Evaluation

## 2022-12-19 ENCOUNTER — Encounter (HOSPITAL_COMMUNITY): Payer: Self-pay | Admitting: Cardiovascular Disease

## 2022-12-19 ENCOUNTER — Ambulatory Visit (HOSPITAL_COMMUNITY): Payer: Commercial Managed Care - PPO | Admitting: Anesthesiology

## 2022-12-19 ENCOUNTER — Ambulatory Visit (HOSPITAL_COMMUNITY)
Admission: RE | Admit: 2022-12-19 | Discharge: 2022-12-19 | Disposition: A | Discharge status: Home / Self Care | Payer: Commercial Managed Care - PPO | Attending: Cardiovascular Disease | Admitting: Cardiovascular Disease

## 2022-12-19 ENCOUNTER — Ambulatory Visit (HOSPITAL_BASED_OUTPATIENT_CLINIC_OR_DEPARTMENT_OTHER): Payer: Commercial Managed Care - PPO | Admitting: Anesthesiology

## 2022-12-19 ENCOUNTER — Encounter (HOSPITAL_COMMUNITY)
Admission: RE | Disposition: A | Discharge status: Home / Self Care | Payer: Self-pay | Source: Home / Self Care | Attending: Cardiovascular Disease

## 2022-12-19 DIAGNOSIS — I4891 Unspecified atrial fibrillation: Secondary | ICD-10-CM

## 2022-12-19 DIAGNOSIS — I5022 Chronic systolic (congestive) heart failure: Secondary | ICD-10-CM | POA: Insufficient documentation

## 2022-12-19 DIAGNOSIS — I10 Essential (primary) hypertension: Secondary | ICD-10-CM

## 2022-12-19 DIAGNOSIS — I4892 Unspecified atrial flutter: Secondary | ICD-10-CM | POA: Insufficient documentation

## 2022-12-19 DIAGNOSIS — I483 Typical atrial flutter: Secondary | ICD-10-CM

## 2022-12-19 DIAGNOSIS — I471 Supraventricular tachycardia, unspecified: Secondary | ICD-10-CM | POA: Insufficient documentation

## 2022-12-19 DIAGNOSIS — G4730 Sleep apnea, unspecified: Secondary | ICD-10-CM

## 2022-12-19 DIAGNOSIS — I429 Cardiomyopathy, unspecified: Secondary | ICD-10-CM | POA: Insufficient documentation

## 2022-12-19 HISTORY — PX: A-FLUTTER ABLATION: EP1230

## 2022-12-19 LAB — GLUCOSE, CAPILLARY
Glucose-Capillary: 112 mg/dL — ABNORMAL HIGH (ref 70–99)
Glucose-Capillary: 99 mg/dL (ref 70–99)

## 2022-12-19 SURGERY — A-FLUTTER ABLATION
Anesthesia: General

## 2022-12-19 MED ORDER — HEPARIN SODIUM (PORCINE) 1000 UNIT/ML IJ SOLN
INTRAMUSCULAR | Status: AC
  Filled 2022-12-19: qty 10

## 2022-12-19 MED ORDER — ROCURONIUM BROMIDE 10 MG/ML (PF) SYRINGE
PREFILLED_SYRINGE | INTRAVENOUS | Status: DC | PRN
  Administered 2022-12-19: 70 mg via INTRAVENOUS

## 2022-12-19 MED ORDER — HEPARIN (PORCINE) IN NACL 1000-0.9 UT/500ML-% IV SOLN
INTRAVENOUS | Status: DC | PRN
  Administered 2022-12-19 (×2): 500 mL

## 2022-12-19 MED ORDER — ACETAMINOPHEN 500 MG PO TABS
1000.0000 mg | ORAL_TABLET | Freq: Once | ORAL | Status: AC
  Administered 2022-12-19: 1000 mg via ORAL
  Filled 2022-12-19: qty 2

## 2022-12-19 MED ORDER — SUGAMMADEX SODIUM 200 MG/2ML IV SOLN
INTRAVENOUS | Status: DC | PRN
  Administered 2022-12-19: 200 mg via INTRAVENOUS

## 2022-12-19 MED ORDER — LIDOCAINE 2% (20 MG/ML) 5 ML SYRINGE
INTRAMUSCULAR | Status: DC | PRN
  Administered 2022-12-19: 60 mg via INTRAVENOUS

## 2022-12-19 MED ORDER — PROPOFOL 10 MG/ML IV BOLUS
INTRAVENOUS | Status: DC | PRN
  Administered 2022-12-19: 170 mg via INTRAVENOUS

## 2022-12-19 MED ORDER — DEXAMETHASONE SODIUM PHOSPHATE 4 MG/ML IJ SOLN
INTRAMUSCULAR | Status: DC | PRN
  Administered 2022-12-19: 5 mg via INTRAVENOUS

## 2022-12-19 MED ORDER — PHENYLEPHRINE HCL-NACL 20-0.9 MG/250ML-% IV SOLN
INTRAVENOUS | Status: DC | PRN
  Administered 2022-12-19: 50 ug/min via INTRAVENOUS

## 2022-12-19 MED ORDER — SODIUM CHLORIDE 0.9 % IV SOLN: INTRAVENOUS | Status: DC

## 2022-12-19 MED ORDER — MIDAZOLAM HCL 5 MG/5ML IJ SOLN
INTRAMUSCULAR | Status: DC | PRN
  Administered 2022-12-19: 2 mg via INTRAVENOUS

## 2022-12-19 MED ORDER — ONDANSETRON HCL 4 MG/2ML IJ SOLN: 4.0000 mg | Freq: Four times a day (QID) | INTRAMUSCULAR | Status: DC | PRN

## 2022-12-19 MED ORDER — ONDANSETRON HCL 4 MG/2ML IJ SOLN
INTRAMUSCULAR | Status: DC | PRN
  Administered 2022-12-19: 4 mg via INTRAVENOUS

## 2022-12-19 MED ORDER — SODIUM CHLORIDE 0.9% FLUSH: 3.0000 mL | INTRAVENOUS | Status: DC | PRN

## 2022-12-19 MED ORDER — HEPARIN SODIUM (PORCINE) 1000 UNIT/ML IJ SOLN
INTRAMUSCULAR | Status: DC | PRN
  Administered 2022-12-19: 1000 [IU] via INTRAVENOUS

## 2022-12-19 MED ORDER — FENTANYL CITRATE (PF) 100 MCG/2ML IJ SOLN
INTRAMUSCULAR | Status: DC | PRN
  Administered 2022-12-19: 50 ug via INTRAVENOUS

## 2022-12-19 MED ORDER — ACETAMINOPHEN 325 MG PO TABS: 650.0000 mg | ORAL_TABLET | ORAL | Status: DC | PRN

## 2022-12-19 MED ORDER — SODIUM CHLORIDE 0.9 % IV SOLN: 250.0000 mL | INTRAVENOUS | Status: DC | PRN

## 2022-12-19 SURGICAL SUPPLY — 15 items
BAG SNAP BAND KOVER 36X36 (MISCELLANEOUS) IMPLANT
CATH JOSEPH QUAD ALLRED 6F REP (CATHETERS) IMPLANT
CATH SMTCH THERMOCOOL SF FJ (CATHETERS) IMPLANT
CATH SOUNDSTAR ECO 8FR (CATHETERS) IMPLANT
CATH WEB BI DIR CSDF CRV REPRO (CATHETERS) IMPLANT
COVER SWIFTLINK CONNECTOR (BAG) IMPLANT
DEVICE CLOSURE MYNXGRIP 6/7F (Vascular Products) IMPLANT
PACK EP LATEX FREE (CUSTOM PROCEDURE TRAY) ×1
PACK EP LF (CUSTOM PROCEDURE TRAY) ×1 IMPLANT
PAD DEFIB RADIO PHYSIO CONN (PAD) ×1 IMPLANT
PATCH CARTO3 (PAD) IMPLANT
SHEATH PINNACLE 8F 10CM (SHEATH) IMPLANT
SHEATH PINNACLE 9F 10CM (SHEATH) IMPLANT
SHEATH PROBE COVER 6X72 (BAG) IMPLANT
TUBING SMART ABLATE COOLFLOW (TUBING) IMPLANT

## 2022-12-19 NOTE — Anesthesia Postprocedure Evaluation (Signed)
Anesthesia Post Note  Patient: Angel Lyons  Procedure(s) Performed: A-FLUTTER ABLATION     Patient location during evaluation: Phase II Anesthesia Type: General Level of consciousness: sedated Pain management: pain level controlled Vital Signs Assessment: post-procedure vital signs reviewed and stable Respiratory status: spontaneous breathing and respiratory function stable Cardiovascular status: stable Postop Assessment: no apparent nausea or vomiting Anesthetic complications: no  No notable events documented.  Last Vitals:  Vitals:   12/19/22 1130 12/19/22 1200  BP: 126/79 (!) 129/90  Pulse: 83 85  Resp: 17 18  Temp:    SpO2: 95% 96%    Last Pain:  Vitals:   12/19/22 1018  TempSrc: Temporal  PainSc: 0-No pain                 Cina Klumpp DANIEL

## 2022-12-19 NOTE — H&P (Signed)
Electrophysiology Office Note:    Date:  12/19/2022   ID:  Angel Lyons, DOB 23-Jul-1960, MRN [id number]  PCP:  Leilani Able, MD   [facility] Providers Cardiologist:  None     Referring MD: No ref. provider found   History of Present Illness:    Angel Lyons is a 62 y.o. male with a hx listed below, significant for CHFrEF, severe MR, referred for arrhythmia management.  He was admitted in January 2024 with cardiomyopathy and rapid SVT, possibly 1-1 atrial flutter.  EKG showed a narrow complex tachycardia at the rate of 240 bpm.  Adenosine did not affect the rhythm.  He underwent urgent DC cardioversion.  He was discharged on Eliquis and metoprolol.   He reports that he has been doing very well since the ablation. He has not had any palpitations, syncope, pre-syncope, chest pain.  He presents today for EP study and ablation. He reports that he has not had any recent changes in his condition, diagnoses, or medications. He has not missed any doses of eliquis.  Past Medical History:  Diagnosis Date   Abnormal heart rhythm    HTN (hypertension)    Sleep apnea     Past Surgical History:  Procedure Laterality Date   NO PAST SURGERIES     RIGHT/LEFT HEART CATH AND CORONARY ANGIOGRAPHY N/A 07/22/2022   Procedure: RIGHT/LEFT HEART CATH AND CORONARY ANGIOGRAPHY;  Surgeon: Marykay Lex, MD;  Location: [facility] INVASIVE CV LAB;  Service: Cardiovascular;  Laterality: N/A;   TEE WITHOUT CARDIOVERSION N/A 07/21/2022   Procedure: TRANSESOPHAGEAL ECHOCARDIOGRAM (TEE);  Surgeon: Pricilla Riffle, MD;  Location: [facility] ENDOSCOPY;  Service: Cardiovascular;  Laterality: N/A;    Current Medications: Current Meds  Medication Sig   apixaban (ELIQUIS) 5 MG TABS tablet Take 1 tablet (5 mg total) by mouth 2 (two) times daily.   empagliflozin (JARDIANCE) 10 MG TABS tablet Take 1 tablet (10 mg total) by mouth daily before breakfast.   ENTRESTO 24-26 MG TAKE 1 TABLET BY MOUTH TWICE DAILY    nitroGLYCERIN (NITROSTAT) 0.4 MG SL tablet Place 1 tablet (0.4 mg total) under the tongue every 5 (five) minutes x 3 doses as needed for up to 60 doses for chest pain.   spironolactone (ALDACTONE) 25 MG tablet Take 1 tablet (25 mg total) by mouth daily.     Allergies:   Patient has no known allergies.   Social and Family History: Reviewed in Epic  ROS:   Please see the history of present illness.    All other systems reviewed and are negative.  EKGs/Labs/Other Studies Reviewed Today:    Cardiac Studies & Procedures   CARDIAC CATHETERIZATION  CARDIAC CATHETERIZATION 07/23/2022  Narrative   Mid RCA lesion is 10% stenosed.   Dist RCA lesion is 15% stenosed.   LV end diastolic pressure is mildly elevated.   Hemodynamic findings consistent with mild pulmonary hypertension.   There is no aortic valve stenosis.  POST-OPERATIVE DIAGNOSIS: Angiographically minimal Mild pulm hypertension with PAP-mean 2 49/9 -9 mmHg.  PCWP 19 mmHg with a large V wave to 33 mmHg LVEDP 17 mmHg.   PLAN OF CARE:  Return to RN unit for ongoing care -> consider CVTS consult for MVR/MAZE & EP for Flutter ablation    Bryan Lemma, MD  Findings Coronary Findings Diagnostic  Dominance: Right  Left Main Vessel was injected. Vessel is normal in caliber. Vessel is angiographically normal.  Left Anterior Descending Vessel was injected. Vessel is normal in  caliber. Vessel is angiographically normal.  First Diagonal Branch Vessel was injected. Vessel is small in size. Vessel is angiographically normal.  Ramus Intermedius Vessel was injected. Vessel is small. Vessel is angiographically normal.  Left Circumflex Vessel was injected. Vessel is normal in caliber. Vessel is angiographically normal.  First Obtuse Marginal Branch Vessel was injected. Vessel is small in size. Vessel is angiographically normal.  Left Posterior Descending Artery Vessel was injected. Vessel is small in size. Vessel is  angiographically normal.  Right Coronary Artery Vessel was injected. Vessel is large. Vessel is angiographically normal. Mid RCA lesion is 10% stenosed. Dist RCA lesion is 15% stenosed.  Acute Marginal Branch Vessel was injected. Vessel is small in size. Vessel is angiographically normal.  Right Posterior Atrioventricular Artery Vessel was injected. Vessel is moderate in size. Vessel is angiographically normal.  First Right Posterolateral Branch Vessel was injected. Vessel is small in size. Vessel is angiographically normal.  Second Right Posterolateral Branch Vessel was injected. Vessel is moderate in size. Vessel is angiographically normal.  Intervention  No interventions have been documented.     ECHOCARDIOGRAM  ECHOCARDIOGRAM COMPLETE 08/19/2022  Narrative ECHOCARDIOGRAM REPORT    Patient Name:   Angel Lyons [facility] Date of Exam: 08/19/2022 Medical Rec #:  [id number]        Height:       72.0 in Accession #:    [id number]       Weight:       256.2 lb Date of Birth:  [birth year]        BSA:          2.367 m Patient Age:    61 years         BP:           144/96 mmHg Patient Gender: M                HR:           57 bpm. Exam Location:  [address]  Procedure: 2D Echo, Cardiac Doppler, Color Doppler, 3D Echo and Strain Analysis  Indications:    CAD Native Vessel I25.10  History:        Patient has prior history of Echocardiogram examinations, most recent 07/21/2022. Risk Factors:Hypertension and Sleep Apnea.  Sonographer:    Eulah Pont RDCS Referring Phys: [id number] Eugenio Hoes   Sonographer Comments: Global longitudinal strain was attempted. IMPRESSIONS   1. Left ventricular ejection fraction, by estimation, is 55%. The left ventricle has normal function. The left ventricle has no regional wall motion abnormalities. Left ventricular diastolic parameters are consistent with Grade II diastolic dysfunction (pseudonormalization). The average left ventricular global  longitudinal strain is -21.8 %. The global longitudinal strain is normal. 2. Right ventricular systolic function is normal. The right ventricular size is normal. There is normal pulmonary artery systolic pressure. The estimated right ventricular systolic pressure is 24.9 mmHg. 3. Left atrial size was mildly dilated. 4. Right atrial size was mildly dilated. 5. The mitral valve is abnormal. There appears to be mild prolapse of the posterior leaflet, no definite flail segment visualized. Moderate eccentric, anteriorly-directed mitral valve regurgitation but there is splay artifact noted so possible that MR could be worse. No evidence of mitral stenosis. 6. The aortic valve is tricuspid. There is mild calcification of the aortic valve. Aortic valve regurgitation is not visualized. 7. Aortic dilatation noted. There is mild dilatation of the ascending aorta, measuring 40 mm. 8. The inferior vena cava is normal in size with greater  than 50% respiratory variability, suggesting right atrial pressure of 3 mmHg.  FINDINGS Left Ventricle: Left ventricular ejection fraction, by estimation, is 55%. The left ventricle has normal function. The left ventricle has no regional wall motion abnormalities. The average left ventricular global longitudinal strain is -21.8 %. The global longitudinal strain is normal. The left ventricular internal cavity size was normal in size. There is no left ventricular hypertrophy. Left ventricular diastolic parameters are consistent with Grade II diastolic dysfunction (pseudonormalization).  Right Ventricle: The right ventricular size is normal. No increase in right ventricular wall thickness. Right ventricular systolic function is normal. There is normal pulmonary artery systolic pressure. The tricuspid regurgitant velocity is 2.34 m/s, and with an assumed right atrial pressure of 3 mmHg, the estimated right ventricular systolic pressure is 24.9 mmHg.  Left Atrium: Left atrial size  was mildly dilated.  Right Atrium: Right atrial size was mildly dilated.  Pericardium: There is no evidence of pericardial effusion.  Mitral Valve: The mitral valve is abnormal. Moderate mitral valve regurgitation. No evidence of mitral valve stenosis.  Tricuspid Valve: The tricuspid valve is normal in structure. Tricuspid valve regurgitation is trivial.  Aortic Valve: The aortic valve is tricuspid. There is mild calcification of the aortic valve. Aortic valve regurgitation is not visualized.  Pulmonic Valve: The pulmonic valve was normal in structure. Pulmonic valve regurgitation is trivial.  Aorta: The aortic root is normal in size and structure and aortic dilatation noted. There is mild dilatation of the ascending aorta, measuring 40 mm.  Venous: The inferior vena cava is normal in size with greater than 50% respiratory variability, suggesting right atrial pressure of 3 mmHg.  IAS/Shunts: No atrial level shunt detected by color flow Doppler.   LEFT VENTRICLE PLAX 2D LVIDd:         5.90 cm      Diastology LVIDs:         3.60 cm      LV e' medial:    5.33 cm/s LV PW:         0.90 cm      LV E/e' medial:  14.6 LV IVS:        0.90 cm      LV e' lateral:   12.60 cm/s LVOT diam:     2.20 cm      LV E/e' lateral: 6.2 LV SV:         81 LV SV Index:   34           2D Longitudinal Strain LVOT Area:     3.80 cm     2D Strain GLS Avg:     -21.8 %  LV Volumes (MOD) LV vol d, MOD A2C: 202.0 ml 3D Volume EF: LV vol d, MOD A4C: 165.0 ml 3D EF:        56 % LV vol s, MOD A2C: 85.0 ml  LV EDV:       206 ml LV vol s, MOD A4C: 57.1 ml  LV ESV:       90 ml LV SV MOD A2C:     117.0 ml LV SV:        116 ml LV SV MOD A4C:     165.0 ml LV SV MOD BP:      113.9 ml  RIGHT VENTRICLE RV S prime:     13.60 cm/s TAPSE (M-mode): 2.2 cm  LEFT ATRIUM             Index  RIGHT ATRIUM           Index LA diam:        4.40 cm 1.86 cm/m   RA Area:     22.10 cm LA Vol (A2C):   62.3 ml 26.32 ml/m   RA Volume:   62.60 ml  26.45 ml/m LA Vol (A4C):   70.1 ml 29.62 ml/m LA Biplane Vol: 65.8 ml 27.80 ml/m AORTIC VALVE LVOT Vmax:   101.00 cm/s LVOT Vmean:  61.800 cm/s LVOT VTI:    0.213 m  AORTA Ao Root diam: 3.20 cm Ao Asc diam:  4.00 cm  MITRAL VALVE                  TRICUSPID VALVE MV Area (PHT): 3.91 cm       TR Peak grad:   21.9 mmHg MV Decel Time: 194 msec       TR Vmax:        234.00 cm/s MR Peak grad:    123.2 mmHg MR Mean grad:    77.0 mmHg    SHUNTS MR Vmax:         555.00 cm/s  Systemic VTI:  0.21 m MR Vmean:        420.0 cm/s   Systemic Diam: 2.20 cm MR PISA:         1.01 cm MR PISA Eff ROA: 7 mm MR PISA Radius:  0.40 cm MV E velocity: 77.60 cm/s MV A velocity: 67.70 cm/s MV E/A ratio:  1.15  Dalton McleanMD Electronically signed by Wilfred Lacy Signature Date/Time: 08/19/2022/1:43:05 PM    Final   TEE  ECHO TEE 07/21/2022  Narrative TRANSESOPHOGEAL ECHO REPORT    Patient Name:   BRITTNEY REMUND Date of Exam: 07/21/2022 Medical Rec #:  [id number]        Height:       76.0 in Accession #:    [id number]       Weight:       220.0 lb Date of Birth:  1960-11-09        BSA:          2.307 m Patient Age:    61 years         BP:           100/65 mmHg Patient Gender: M                HR:           82 bpm. Exam Location:  Inpatient  Procedure: Transesophageal Echo, 3D Echo, Color Doppler and Cardiac Doppler  Indications:     Mitral Regurgitation  History:         Patient has prior history of Echocardiogram examinations, most recent 07/20/2022. Signs/Symptoms:Shortness of Breath; Risk Factors:Sleep Apnea and Hypertension.  Sonographer:     Eulah Pont RDCS Referring Phys:  Dietrich Pates, V Diagnosing Phys: Dietrich Pates MD  PROCEDURE: After discussion of the risks and benefits of a TEE, an informed consent was obtained from the patient. The transesophogeal probe was passed without difficulty through the esophogus of the patient. Local oropharyngeal  anesthetic was provided with viscous lidocaine. Sedation performed by different physician. The patient was monitored while under deep sedation. Anesthestetic sedation was provided intravenously by Anesthesiology: 538.22mg  of Propofol. The patient's vital signs; including heart rate, blood pressure, and oxygen saturation; remained stable throughout the procedure. The patient developed no complications during the procedure.  IMPRESSIONS   1. The left ventricle has low normal  function. 2. Right ventricular systolic function is mildly reduced. The right ventricular size is normal. 3. No left atrial/left atrial appendage thrombus was detected. 4. Prolapes of teh P2 segment of the posterior mitral leaflet with noncoaptation in this region. MR is directed more anterior into LA with some splaying of the regurgitant signal.. There is a small amount of back flow into R upper pulmonary vein consistent with severe MR (at SBP of 93 mm Hg). 5. Tricuspid valve regurgitation is mild to moderate. 6. The aortic valve is tricuspid. Aortic valve regurgitation is trivial. Aortic valve sclerosis is present, with no evidence of aortic valve stenosis.  FINDINGS Left Ventricle: The left ventricle has low normal function. The left ventricular internal cavity size was normal in size.  Right Ventricle: The right ventricular size is normal. Right ventricular systolic function is mildly reduced.  Left Atrium: Left atrial size was normal in size. No left atrial/left atrial appendage thrombus was detected.  Pericardium: There is no evidence of pericardial effusion.  Mitral Valve: Prolapes of teh P2 segment of the posterior mitral leaflet with noncoaptation in this region. MR is directed more anterior into LA with some splaying of the regurgitant signal.. There is a small amount of back flow into R upper pulmonary vein consistent with severe MR (at SBP of 93 mm Hg).  Tricuspid Valve: The tricuspid valve is normal in  structure. Tricuspid valve regurgitation is mild to moderate.  Aortic Valve: The aortic valve is tricuspid. Aortic valve regurgitation is trivial. Aortic valve sclerosis is present, with no evidence of aortic valve stenosis.  Pulmonic Valve: The pulmonic valve was grossly normal. Pulmonic valve regurgitation is trivial.  Aorta: The aortic root is normal in size and structure. There is minimal (Grade I) plaque.  Additional Comments: Spectral Doppler performed.  MR Peak grad: 111.1 mmHg MR Mean grad: 77.0 mmHg MR Vmax:      527.00 cm/s MR Vmean:     424.0 cm/s  Dietrich Pates MD Electronically signed by Dietrich Pates MD Signature Date/Time: 07/21/2022/5:37:05 PM    Final            EKG:  Last EKG results: sinus bradycardia   Recent Labs: 07/20/2022: B Natriuretic Peptide 114.4; Magnesium 1.8; TSH 2.380 07/22/2022: ALT 54 12/05/2022: BUN 13; Creatinine, Ser 1.39; Hemoglobin 14.3; Platelets 238; Potassium 4.3; Sodium 139     Physical Exam:    VS:  BP (!) 158/92   Pulse 80   Temp 99 F (37.2 C) (Temporal)   Resp 18   Ht 5\' 11"  (1.803 m)   Wt 113.4 kg   SpO2 97%   BMI 34.87 kg/m     Wt Readings from Last 3 Encounters:  12/19/22 113.4 kg  09/12/22 115.1 kg  08/29/22 117 kg     GEN: Well nourished, well developed in no acute distress CARDIAC: RRR, no murmurs, rubs, gallops RESPIRATORY:  Normal work of breathing MUSCULOSKELETAL: no edema    ASSESSMENT & PLAN:    SVT: possibly 1:1 flutter. Will schedule EP study and ablation. If we do not induce the arrhythmia or demonstrate substrate for SVT, we will plan for CTI line. Will use general anesthesia and perform an EP study with/without isoproterenol.   We discussed the indication, rationale, logistics, anticipated benefits, and potential risks of the ablation procedure including but not limited to -- bleed at the groin access site, chest pain, damage to nearby organs such as the diaphragm, lungs, or esophagus, need for a  drainage tube, or prolonged  hospitalization. I explained that the risk for stroke, heart attack, need for open chest surgery, or even death is very low but not zero. he  expressed understanding and wishes to proceed.         Medication Adjustments/Labs and Tests Ordered: Current medicines are reviewed at length with the patient today.  Concerns regarding medicines are outlined above.  Orders Placed This Encounter  Procedures   Glucose, capillary   Informed Consent Details: Physician/Practitioner Attestation; Transcribe to consent form and obtain patient signature   Initiate Pre-op Protocol   Void on call to EP Lab   Confirm CBC and BMP (or CMP) results within 7 days for inpatient and 30 days for outpatient:   Clip right and left femoral area PM before surgery   Clip right internal jugular area PM before surgery   Pre-admission testing diagnosis   EP STUDY   Insert peripheral IV   Meds ordered this encounter  Medications   0.9 %  sodium chloride infusion   acetaminophen (TYLENOL) tablet 1,000 mg         Signed, Maurice Small, MD  12/19/2022 7:11 AM    [facility]

## 2022-12-19 NOTE — Progress Notes (Signed)
Patient and wife was given discharge instructions. Both verbalized understanding. 

## 2022-12-19 NOTE — Anesthesia Procedure Notes (Signed)
Procedure Name: Intubation Date/Time: 12/19/2022 7:50 AM  Performed by: Caren Macadam, CRNAPre-anesthesia Checklist: Patient identified, Emergency Drugs available, Suction available and Patient being monitored Patient Re-evaluated:Patient Re-evaluated prior to induction Oxygen Delivery Method: Circle system utilized Preoxygenation: Pre-oxygenation with 100% oxygen Induction Type: IV induction Ventilation: Mask ventilation without difficulty Laryngoscope Size: Miller and 2 Grade View: Grade I Tube type: Oral Tube size: 8.0 mm Number of attempts: 1 Airway Equipment and Method: Stylet Placement Confirmation: ETT inserted through vocal cords under direct vision, positive ETCO2 and breath sounds checked- equal and bilateral Secured at: 22 cm Tube secured with: Tape Dental Injury: Teeth and Oropharynx as per pre-operative assessment

## 2022-12-19 NOTE — Discharge Instructions (Signed)

## 2022-12-19 NOTE — Transfer of Care (Signed)
Immediate Anesthesia Transfer of Care Note  Patient: Angel Lyons  Procedure(s) Performed: A-FLUTTER ABLATION  Patient Location: Cath Lab  Anesthesia Type:General  Level of Consciousness: awake and alert   Airway & Oxygen Therapy: Patient Spontanous Breathing and Patient connected to nasal cannula oxygen  Post-op Assessment: Report given to RN and Post -op Vital signs reviewed and stable  Post vital signs: Reviewed and stable  Last Vitals:  Vitals Value Taken Time  BP 126/82 12/19/22 0952  Temp 36.7 C 12/19/22 0951  Pulse 86 12/19/22 0955  Resp 20 12/19/22 0955  SpO2 96 % 12/19/22 0955  Vitals shown include unvalidated device data.  Last Pain:  Vitals:   12/19/22 0951  TempSrc: Temporal  PainSc: 0-No pain         Complications: No notable events documented.

## 2022-12-20 ENCOUNTER — Encounter (HOSPITAL_COMMUNITY): Payer: Self-pay | Admitting: Cardiovascular Disease

## 2023-01-17 ENCOUNTER — Encounter: Payer: Self-pay | Admitting: Cardiovascular Disease

## 2023-01-17 ENCOUNTER — Ambulatory Visit: Payer: Commercial Managed Care - PPO | Attending: Cardiovascular Disease | Admitting: Cardiovascular Disease

## 2023-01-17 VITALS — BP 138/90 | HR 75 | Ht 71.0 in | Wt 242.8 lb

## 2023-01-17 DIAGNOSIS — I471 Supraventricular tachycardia, unspecified: Secondary | ICD-10-CM | POA: Diagnosis not present

## 2023-01-17 DIAGNOSIS — I4892 Unspecified atrial flutter: Secondary | ICD-10-CM

## 2023-01-17 NOTE — Patient Instructions (Addendum)
 Medication Instructions:  STOP Eliquis *If you need a refill on your cardiac medications before your next appointment, please call your pharmacy*   Follow-Up: At [facility], you and your health needs are our priority.  As part of our continuing mission to provide you with exceptional heart care, we have created designated Provider Care Teams.  These Care Teams include your primary Cardiologist (physician) and Advanced Practice Providers (APPs -  Physician Assistants and Nurse Practitioners) who all work together to provide you with the care you need, when you need it.  We recommend signing up for the patient portal called "MyChart".  Sign up information is provided on this After Visit Summary.  MyChart is used to connect with patients for Virtual Visits (Telemedicine).  Patients are able to view lab/test results, encounter notes, upcoming appointments, etc.  Non-urgent messages can be sent to your provider as well.   To learn more about what you can do with MyChart, go to [email or web address].    Your next appointment:   1 year(s)  Provider:   York Pellant, MD

## 2023-01-17 NOTE — Progress Notes (Signed)
Electrophysiology Office Note:    Date:  01/17/2023   ID:  Angel Lyons, DOB Nov 02, 1960, MRN [id number]  PCP:  Angel Able, MD   [facility] Providers Cardiologist:  None     Referring MD: Angel Able, MD   History of Present Illness:    Angel Lyons is a 62 y.o. male with a hx listed below, significant for CHFrEF, severe MR, referred for arrhythmia management.  He was admitted in January 2024 with cardiomyopathy and rapid SVT, possibly 1-1 atrial flutter.  EKG showed a narrow complex tachycardia at the rate of 240 bpm.  Adenosine did not affect the rhythm.  He underwent urgent DC cardioversion.  He was discharged on Eliquis and metoprolol.   He reports that he has been doing very well since the cardioversion. He has not had any palpitations, syncope, pre-syncope, chest pain.  He underwent EP study on December 19, 2022.  We did not identify any substrate for reentrant tachycardia such as an atrial ventricular accessory pathway or an AV nodal slow pathway.  With rapid atrial pacing, he did conduct to the ventricle at fast rates with right bundle branch block aberrancy, which is consistent with his prior arrhythmias.  We performed a CTI line given suspicion for 1-1 typical atrial flutter.  Today, he reports that he has generally felt well since the ablation.  He has not had recurrence of rapid rhythms with shortness of breath that he experienced prior to his ER visit previously.  He does notice that his heart rate increases somewhat when he gets anxious or stressed.     EKGs/Labs/Other Studies Reviewed Today:    Cardiac Studies & Procedures   CARDIAC CATHETERIZATION  CARDIAC CATHETERIZATION 07/22/2022  Narrative   Mid RCA lesion is 10% stenosed.   Dist RCA lesion is 15% stenosed.   LV end diastolic pressure is mildly elevated.   Hemodynamic findings consistent with mild pulmonary hypertension.   There is no aortic valve stenosis.  POST-OPERATIVE  DIAGNOSIS: Angiographically minimal Mild pulm hypertension with PAP-mean 2 49/9 -9 mmHg.  PCWP 19 mmHg with a large V wave to 33 mmHg LVEDP 17 mmHg.   PLAN OF CARE:  Return to RN unit for ongoing care -> consider CVTS consult for MVR/MAZE & EP for Flutter ablation    Angel Lemma, MD  Findings Coronary Findings Diagnostic  Dominance: Right  Left Main Vessel was injected. Vessel is normal in caliber. Vessel is angiographically normal.  Left Anterior Descending Vessel was injected. Vessel is normal in caliber. Vessel is angiographically normal.  First Diagonal Branch Vessel was injected. Vessel is small in size. Vessel is angiographically normal.  Ramus Intermedius Vessel was injected. Vessel is small. Vessel is angiographically normal.  Left Circumflex Vessel was injected. Vessel is normal in caliber. Vessel is angiographically normal.  First Obtuse Marginal Branch Vessel was injected. Vessel is small in size. Vessel is angiographically normal.  Left Posterior Descending Artery Vessel was injected. Vessel is small in size. Vessel is angiographically normal.  Right Coronary Artery Vessel was injected. Vessel is large. Vessel is angiographically normal. Mid RCA lesion is 10% stenosed. Dist RCA lesion is 15% stenosed.  Acute Marginal Branch Vessel was injected. Vessel is small in size. Vessel is angiographically normal.  Right Posterior Atrioventricular Artery Vessel was injected. Vessel is moderate in size. Vessel is angiographically normal.  First Right Posterolateral Branch Vessel was injected. Vessel is small in size. Vessel is angiographically normal.  Second Right Posterolateral Branch Vessel was injected.  Vessel is moderate in size. Vessel is angiographically normal.  Intervention  No interventions have been documented.     ECHOCARDIOGRAM  ECHOCARDIOGRAM COMPLETE 08/19/2022  Narrative ECHOCARDIOGRAM REPORT    Patient Name:   Angel Lyons [facility] Date  of Exam: 08/19/2022 Medical Rec #:  [id number]        Height:       72.0 in Accession #:    [id number]       Weight:       256.2 lb Date of Birth:  06-11-1961        BSA:          2.367 m Patient Age:    61 years         BP:           144/96 mmHg Patient Gender: M                HR:           57 bpm. Exam Location:  [address]  Procedure: 2D Echo, Cardiac Doppler, Color Doppler, 3D Echo and Strain Analysis  Indications:    CAD Native Vessel I25.10  History:        Patient has prior history of Echocardiogram examinations, most recent 07/21/2022. Risk Factors:Hypertension and Sleep Apnea.  Sonographer:    Angel Lyons RDCS Referring Phys: [id number] Angel Lyons   Sonographer Comments: Global longitudinal strain was attempted. IMPRESSIONS   1. Left ventricular ejection fraction, by estimation, is 55%. The left ventricle has normal function. The left ventricle has no regional wall motion abnormalities. Left ventricular diastolic parameters are consistent with Grade II diastolic dysfunction (pseudonormalization). The average left ventricular global longitudinal strain is -21.8 %. The global longitudinal strain is normal. 2. Right ventricular systolic function is normal. The right ventricular size is normal. There is normal pulmonary artery systolic pressure. The estimated right ventricular systolic pressure is 24.9 mmHg. 3. Left atrial size was mildly dilated. 4. Right atrial size was mildly dilated. 5. The mitral valve is abnormal. There appears to be mild prolapse of the posterior leaflet, no definite flail segment visualized. Moderate eccentric, anteriorly-directed mitral valve regurgitation but there is splay artifact noted so possible that MR could be worse. No evidence of mitral stenosis. 6. The aortic valve is tricuspid. There is mild calcification of the aortic valve. Aortic valve regurgitation is not visualized. 7. Aortic dilatation noted. There is mild dilatation of the ascending  aorta, measuring 40 mm. 8. The inferior vena cava is normal in size with greater than 50% respiratory variability, suggesting right atrial pressure of 3 mmHg.  FINDINGS Left Ventricle: Left ventricular ejection fraction, by estimation, is 55%. The left ventricle has normal function. The left ventricle has no regional wall motion abnormalities. The average left ventricular global longitudinal strain is -21.8 %. The global longitudinal strain is normal. The left ventricular internal cavity size was normal in size. There is no left ventricular hypertrophy. Left ventricular diastolic parameters are consistent with Grade II diastolic dysfunction (pseudonormalization).  Right Ventricle: The right ventricular size is normal. No increase in right ventricular wall thickness. Right ventricular systolic function is normal. There is normal pulmonary artery systolic pressure. The tricuspid regurgitant velocity is 2.34 m/s, and with an assumed right atrial pressure of 3 mmHg, the estimated right ventricular systolic pressure is 24.9 mmHg.  Left Atrium: Left atrial size was mildly dilated.  Right Atrium: Right atrial size was mildly dilated.  Pericardium: There is no evidence of pericardial effusion.  Mitral Valve:  The mitral valve is abnormal. Moderate mitral valve regurgitation. No evidence of mitral valve stenosis.  Tricuspid Valve: The tricuspid valve is normal in structure. Tricuspid valve regurgitation is trivial.  Aortic Valve: The aortic valve is tricuspid. There is mild calcification of the aortic valve. Aortic valve regurgitation is not visualized.  Pulmonic Valve: The pulmonic valve was normal in structure. Pulmonic valve regurgitation is trivial.  Aorta: The aortic root is normal in size and structure and aortic dilatation noted. There is mild dilatation of the ascending aorta, measuring 40 mm.  Venous: The inferior vena cava is normal in size with greater than 50% respiratory variability,  suggesting right atrial pressure of 3 mmHg.  IAS/Shunts: No atrial level shunt detected by color flow Doppler.   LEFT VENTRICLE PLAX 2D LVIDd:         5.90 cm      Diastology LVIDs:         3.60 cm      LV e' medial:    5.33 cm/s LV PW:         0.90 cm      LV E/e' medial:  14.6 LV IVS:        0.90 cm      LV e' lateral:   12.60 cm/s LVOT diam:     2.20 cm      LV E/e' lateral: 6.2 LV SV:         81 LV SV Index:   34           2D Longitudinal Strain LVOT Area:     3.80 cm     2D Strain GLS Avg:     -21.8 %  LV Volumes (MOD) LV vol d, MOD A2C: 202.0 ml 3D Volume EF: LV vol d, MOD A4C: 165.0 ml 3D EF:        56 % LV vol s, MOD A2C: 85.0 ml  LV EDV:       206 ml LV vol s, MOD A4C: 57.1 ml  LV ESV:       90 ml LV SV MOD A2C:     117.0 ml LV SV:        116 ml LV SV MOD A4C:     165.0 ml LV SV MOD BP:      113.9 ml  RIGHT VENTRICLE RV S prime:     13.60 cm/s TAPSE (M-mode): 2.2 cm  LEFT ATRIUM             Index        RIGHT ATRIUM           Index LA diam:        4.40 cm 1.86 cm/m   RA Area:     22.10 cm LA Vol (A2C):   62.3 ml 26.32 ml/m  RA Volume:   62.60 ml  26.45 ml/m LA Vol (A4C):   70.1 ml 29.62 ml/m LA Biplane Vol: 65.8 ml 27.80 ml/m AORTIC VALVE LVOT Vmax:   101.00 cm/s LVOT Vmean:  61.800 cm/s LVOT VTI:    0.213 m  AORTA Ao Root diam: 3.20 cm Ao Asc diam:  4.00 cm  MITRAL VALVE                  TRICUSPID VALVE MV Area (PHT): 3.91 cm       TR Peak grad:   21.9 mmHg MV Decel Time: 194 msec       TR Vmax:  234.00 cm/s MR Peak grad:    123.2 mmHg MR Mean grad:    77.0 mmHg    SHUNTS MR Vmax:         555.00 cm/s  Systemic VTI:  0.21 m MR Vmean:        420.0 cm/s   Systemic Diam: 2.20 cm MR PISA:         1.01 cm MR PISA Eff ROA: 7 mm MR PISA Radius:  0.40 cm MV E velocity: 77.60 cm/s MV A velocity: 67.70 cm/s MV E/A ratio:  1.15  Dalton McleanMD Electronically signed by Wilfred Lacy Signature Date/Time: 08/19/2022/1:43:05 PM    Final    TEE  ECHO TEE 07/21/2022  Narrative TRANSESOPHOGEAL ECHO REPORT    Patient Name:   EDUIN FRIEDEL Date of Exam: 07/21/2022 Medical Rec #:  [id number]        Height:       76.0 in Accession #:    [id number]       Weight:       220.0 lb Date of Birth:  January 11, 1961        BSA:          2.307 m Patient Age:    61 years         BP:           100/65 mmHg Patient Gender: M                HR:           82 bpm. Exam Location:  Inpatient  Procedure: Transesophageal Echo, 3D Echo, Color Doppler and Cardiac Doppler  Indications:     Mitral Regurgitation  History:         Patient has prior history of Echocardiogram examinations, most recent 07/20/2022. Signs/Symptoms:Shortness of Breath; Risk Factors:Sleep Apnea and Hypertension.  Sonographer:     Angel Lyons RDCS Referring Phys:  Dietrich Pates, V Diagnosing Phys: Dietrich Pates MD  PROCEDURE: After discussion of the risks and benefits of a TEE, an informed consent was obtained from the patient. The transesophogeal probe was passed without difficulty through the esophogus of the patient. Local oropharyngeal anesthetic was provided with viscous lidocaine. Sedation performed by different physician. The patient was monitored while under deep sedation. Anesthestetic sedation was provided intravenously by Anesthesiology: 538.22mg  of Propofol. The patient's vital signs; including heart rate, blood pressure, and oxygen saturation; remained stable throughout the procedure. The patient developed no complications during the procedure.  IMPRESSIONS   1. The left ventricle has low normal function. 2. Right ventricular systolic function is mildly reduced. The right ventricular size is normal. 3. No left atrial/left atrial appendage thrombus was detected. 4. Prolapes of teh P2 segment of the posterior mitral leaflet with noncoaptation in this region. MR is directed more anterior into LA with some splaying of the regurgitant signal.. There is a small amount  of back flow into R upper pulmonary vein consistent with severe MR (at SBP of 93 mm Hg). 5. Tricuspid valve regurgitation is mild to moderate. 6. The aortic valve is tricuspid. Aortic valve regurgitation is trivial. Aortic valve sclerosis is present, with no evidence of aortic valve stenosis.  FINDINGS Left Ventricle: The left ventricle has low normal function. The left ventricular internal cavity size was normal in size.  Right Ventricle: The right ventricular size is normal. Right ventricular systolic function is mildly reduced.  Left Atrium: Left atrial size was normal in size. No left atrial/left atrial appendage thrombus was detected.  Pericardium: There is no evidence of pericardial effusion.  Mitral Valve: Prolapes of teh P2 segment of the posterior mitral leaflet with noncoaptation in this region. MR is directed more anterior into LA with some splaying of the regurgitant signal.. There is a small amount of back flow into R upper pulmonary vein consistent with severe MR (at SBP of 93 mm Hg).  Tricuspid Valve: The tricuspid valve is normal in structure. Tricuspid valve regurgitation is mild to moderate.  Aortic Valve: The aortic valve is tricuspid. Aortic valve regurgitation is trivial. Aortic valve sclerosis is present, with no evidence of aortic valve stenosis.  Pulmonic Valve: The pulmonic valve was grossly normal. Pulmonic valve regurgitation is trivial.  Aorta: The aortic root is normal in size and structure. There is minimal (Grade I) plaque.  Additional Comments: Spectral Doppler performed.  MR Peak grad: 111.1 mmHg MR Mean grad: 77.0 mmHg MR Vmax:      527.00 cm/s MR Vmean:     424.0 cm/s  Dietrich Pates MD Electronically signed by Dietrich Pates MD Signature Date/Time: 07/21/2022/5:37:05 PM    Final            EKG:   EKG Interpretation Date/Time:  Tuesday January 17 2023 11:20:45 EDT Ventricular Rate:  75 PR Interval:  164 QRS Duration:  94 QT Interval:  366 QTC  Calculation: 408 R Axis:   9  Text Interpretation: Normal sinus rhythm Normal ECG When compared with ECG of 19-Dec-2022 09:54, No significant change was found Confirmed by York Pellant [phone]) on 01/17/2023 11:56:09 AM     Recent Labs: 07/20/2022: B Natriuretic Peptide 114.4; Magnesium 1.8; TSH 2.380 07/22/2022: ALT 54 12/05/2022: BUN 13; Creatinine, Ser 1.39; Hemoglobin 14.3; Platelets 238; Potassium 4.3; Sodium 139     Physical Exam:    VS:  BP (!) 138/90   Pulse 75   Ht 5\' 11"  (1.803 m)   Wt 242 lb 12.8 oz (110.1 kg)   SpO2 98%   BMI 33.86 kg/m     Wt Readings from Last 3 Encounters:  01/17/23 242 lb 12.8 oz (110.1 kg)  12/19/22 250 lb (113.4 kg)  09/12/22 253 lb 12.8 oz (115.1 kg)     GEN: Well nourished, well developed in no acute distress CARDIAC: RRR, no murmurs, rubs, gallops RESPIRATORY:  Normal work of breathing MUSCULOSKELETAL: no edema    ASSESSMENT & PLAN:    SVT --presumed typical atrial flutter Noninducible during EP study No substrate for reentrant SVT identified during EP study He is status post CTI line for presumed atrial flutter with one-to-one conduction No evidence of recurrence since ablation Will give return to work note today Discontinue Eliquis and continue to monitor   CHF recovered EF  Tachycardia induced Continue spironolactone 25, Entresto 24-26, Jardiance 10        Medication Adjustments/Labs and Tests Ordered: Current medicines are reviewed at length with the patient today.  Concerns regarding medicines are outlined above.  Orders Placed This Encounter  Procedures   EKG 12-Lead   No orders of the defined types were placed in this encounter.        Signed, Maurice Small, MD  01/17/2023 11:56 AM    [facility]

## 2023-01-23 ENCOUNTER — Other Ambulatory Visit: Payer: Self-pay | Admitting: Cardiology

## 2023-03-01 ENCOUNTER — Other Ambulatory Visit (HOSPITAL_COMMUNITY): Payer: Self-pay | Admitting: Adult Health

## 2023-08-07 ENCOUNTER — Other Ambulatory Visit (HOSPITAL_COMMUNITY): Payer: Self-pay | Admitting: Adult Health

## 2023-08-09 NOTE — Telephone Encounter (Signed)
 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 He hasn't been assigned a cardiologist after being released [facility]. He had been with Dr. Jacquelynn Matter. Is this something that Dr. Arlester Ladd wants to refill? Please advise.

## 2023-08-16 ENCOUNTER — Other Ambulatory Visit (HOSPITAL_COMMUNITY): Payer: Self-pay | Admitting: Adult Health

## 2023-10-22 ENCOUNTER — Other Ambulatory Visit (HOSPITAL_COMMUNITY): Payer: Self-pay | Admitting: Adult Health

## 2023-12-22 ENCOUNTER — Other Ambulatory Visit (HOSPITAL_COMMUNITY): Payer: Self-pay | Admitting: Cardiology

## 2023-12-26 ENCOUNTER — Encounter: Payer: Self-pay | Admitting: *Deleted

## 2023-12-26 ENCOUNTER — Encounter: Payer: Self-pay | Admitting: Cardiology

## 2023-12-26 ENCOUNTER — Other Ambulatory Visit (HOSPITAL_COMMUNITY): Payer: Self-pay

## 2023-12-26 ENCOUNTER — Ambulatory Visit: Attending: Cardiology | Admitting: Cardiology

## 2023-12-26 VITALS — BP 120/84 | HR 59 | Ht 71.5 in | Wt 239.8 lb

## 2023-12-26 DIAGNOSIS — E782 Mixed hyperlipidemia: Secondary | ICD-10-CM | POA: Diagnosis not present

## 2023-12-26 DIAGNOSIS — I340 Nonrheumatic mitral (valve) insufficiency: Secondary | ICD-10-CM | POA: Diagnosis not present

## 2023-12-26 DIAGNOSIS — R0600 Dyspnea, unspecified: Secondary | ICD-10-CM

## 2023-12-26 DIAGNOSIS — R0609 Other forms of dyspnea: Secondary | ICD-10-CM | POA: Insufficient documentation

## 2023-12-26 DIAGNOSIS — R002 Palpitations: Secondary | ICD-10-CM | POA: Diagnosis not present

## 2023-12-26 DIAGNOSIS — I4892 Unspecified atrial flutter: Secondary | ICD-10-CM

## 2023-12-26 MED ORDER — EMPAGLIFLOZIN 10 MG PO TABS: 10.0000 mg | ORAL_TABLET | Freq: Every day | ORAL | 3 refills | Status: DC

## 2023-12-26 MED ORDER — SPIRONOLACTONE 25 MG PO TABS: 25.0000 mg | ORAL_TABLET | Freq: Every day | ORAL | 3 refills | Status: DC

## 2023-12-26 MED ORDER — NITROGLYCERIN 0.4 MG SL SUBL: 0.4000 mg | SUBLINGUAL_TABLET | SUBLINGUAL | 2 refills | Status: DC | PRN

## 2023-12-26 MED ORDER — EMPAGLIFLOZIN 10 MG PO TABS
10.0000 mg | ORAL_TABLET | Freq: Every day | ORAL | 3 refills | Status: DC
  Filled 2023-12-26: qty 90, 90d supply, fill #0

## 2023-12-26 MED ORDER — SPIRONOLACTONE 25 MG PO TABS
12.5000 mg | ORAL_TABLET | Freq: Every day | ORAL | 3 refills | Status: AC
  Filled 2023-12-26: qty 45, 90d supply, fill #0

## 2023-12-26 NOTE — Patient Instructions (Addendum)
 Medication Instructions:  Your physician recommends that you continue on your current medications as directed. Please refer to the Current Medication list given to you today.  *If you need a refill on your cardiac medications before your next appointment, please call your pharmacy*  Lab Work: Lipid Panel and Pro BNP and BMP  If you have labs (blood work) drawn today and your tests are completely normal, you will receive your results only by: MyChart Message (if you have MyChart) OR A paper copy in the mail If you have any lab test that is abnormal or we need to change your treatment, we will call you to review the results.  Testing/Procedures: Your physician has requested that you have an echocardiogram. Echocardiography is a painless test that uses sound waves to create images of your heart. It provides your doctor with information about the size and shape of your heart and how well your heart's chambers and valves are working. This procedure takes approximately one hour. There are no restrictions for this procedure. Please do NOT wear cologne, perfume, aftershave, or lotions (deodorant is allowed). Please arrive 15 minutes prior to your appointment time.  Please note: We ask at that you not bring children with you during ultrasound (echo/ vascular) testing. Due to room size and safety concerns, children are not allowed in the ultrasound rooms during exams. Our front office staff cannot provide observation of children in our lobby area while testing is being conducted. An adult accompanying a patient to their appointment will only be allowed in the ultrasound room at the discretion of the ultrasound technician under special circumstances. We apologize for any inconvenience.   Follow-Up: At [facility], you and your health needs are our priority.  As part of our continuing mission to provide you with exceptional heart care, our providers are all part of one team.  This team includes  your primary Cardiologist (physician) and Advanced Practice Providers or APPs (Physician Assistants and Nurse Practitioners) who all work together to provide you with the care you need, when you need it.  Your next appointment:   3 months with Dr Elmira  We recommend signing up for the patient portal called MyChart.  Sign up information is provided on this After Visit Summary.  MyChart is used to connect with patients for Virtual Visits (Telemedicine).  Patients are able to view lab/test results, encounter notes, upcoming appointments, etc.  Non-urgent messages can be sent to your provider as well.   To learn more about what you can do with MyChart, go to [email or web address].   Other Instructions [employer] Cardiac Event Monitor Instructions  Your physician has requested you wear your cardiac event monitor for __30___ days, (1-30). [employer] may call or text to confirm a shipping address. The monitor will be sent to a land address via UPS. [employer] will not ship a monitor to a PO BOX. It typically takes 3-5 days to receive your monitor after it has been enrolled. [employer] will assist with USPS tracking if your package is delayed. The telephone number for [employer] is [phone]. Once you have received your monitor, please review the enclosed instructions. Instruction tutorials can also be viewed under help and settings on the enclosed cell phone. Your monitor has already been registered assigning a specific monitor serial # to you.  Billing and Self Pay Discount Information  [employer] has been provided the insurance information we had on file for you.  If your insurance has been updated, please call [employer] at [phone] to provide  them with your updated insurance information.   [employer] offers a discounted Self Pay option for patients who have insurance that does not cover their cardiac event monitor or patients without insurance.  The discounted cost of a Self Pay  Cardiac Event Monitor would be $225.00 , if the patient contacts [employer] at [phone] within 7 days of applying the monitor to make payment arrangements.  If the patient does not contact [employer] within 7 days of applying the monitor, the cost of the cardiac event monitor will be $350.00.  Applying the monitor  Remove cell phone from case and turn it on. The cell phone works as IT consultant and needs to be within [employer] of you at all times. The cell phone will need to be charged on a daily basis. We recommend you plug the cell phone into the enclosed charger at your bedside table every night.  Monitor batteries: You will receive two monitor batteries labelled #1 and #2. These are your recorders. Plug battery #2 onto the second connection on the enclosed charger. Keep one battery on the charger at all times. This will keep the monitor battery deactivated. It will also keep it fully charged for when you need to switch your monitor batteries. A small light will be blinking on the battery emblem when it is charging. The light on the battery emblem will remain on when the battery is fully charged.  Open package of a Monitor strip. Insert battery #1 into black hood on strip and gently squeeze monitor battery onto connection as indicated in instruction booklet. Set aside while preparing skin.  Choose location for your strip, vertical or horizontal, as indicated in the instruction booklet. Shave to remove all hair from location. There cannot be any lotions, oils, powders, or colognes on skin where monitor is to be applied. Wipe skin clean with enclosed Saline wipe. Dry skin completely.  Peel paper labeled #1 off the back of the Monitor strip exposing the adhesive. Place the monitor on the chest in the vertical or horizontal position shown in the instruction booklet. One arrow on the monitor strip must be pointing upward. Carefully remove paper labeled #2, attaching remainder of strip to  your skin. Try not to create any folds or wrinkles in the strip as you apply it.  Firmly press and release the circle in the center of the monitor battery. You will hear a small beep. This is turning the monitor battery on. The heart emblem on the monitor battery will light up every 5 seconds if the monitor battery in turned on and connected to the patient securely. Do not push and hold the circle down as this turns the monitor battery off. The cell phone will locate the monitor battery. A screen will appear on the cell phone checking the connection of your monitor strip. This may read poor connection initially but change to good connection within the next minute. Once your monitor accepts the connection you will hear a series of 3 beeps followed by a climbing crescendo of beeps. A screen will appear on the cell phone showing the two monitor strip placement options. Touch the picture that demonstrates where you applied the monitor strip.  Your monitor strip and battery are waterproof. You are able to shower, bathe, or swim with the monitor on. They just ask you do not submerge deeper than 3 feet underwater. We recommend removing the monitor if you are swimming in a lake, river, or ocean.  Your monitor battery will need to  be switched to a fully charged monitor battery approximately once a week. The cell phone will alert you of an action which needs to be made.  On the cell phone, tap for details to reveal connection status, monitor battery status, and cell phone battery status. The green dots indicates your monitor is in good status. A red dot indicates there is something that needs your attention.  To record a symptom, click the circle on the monitor battery. In 30-60 seconds a list of symptoms will appear on the cell phone. Select your symptom and tap save. Your monitor will record a sustained or significant arrhythmia regardless of you clicking the button. Some patients do not feel the  heart rhythm irregularities. [employer] will notify us  of any serious or critical events.  Refer to instruction booklet for instructions on switching batteries, changing strips, the Do not disturb or Pause features, or any additional questions.  Call [employer] at [phone], to confirm your monitor is transmitting and record your baseline. They will answer any questions you may have regarding the monitor instructions at that time.  Returning the monitor to [employer]  Place all equipment back into blue box. Peel off strip of paper to expose adhesive and close box securely. There is a prepaid UPS shipping label on this box. Drop in a UPS drop box, or at a UPS facility like [employer]. You may also contact [employer] to arrange UPS to pick up monitor package at your home.

## 2023-12-26 NOTE — Progress Notes (Unsigned)
 Patient enrolled for [employer]/ [employer] to ship a 30 day cardiac event monitor to his address on file.

## 2023-12-26 NOTE — Progress Notes (Signed)
 Cardiology Office Note:  .   Date:  12/26/2023  ID:  Angel Lyons, DOB Mar 01, 1961, MRN [id number] PCP: Ilah Crigler, MD  [facility] Providers Cardiologist:  Newman Lawrence, MD PCP: Ilah Crigler, MD  Chief Complaint  Patient presents with   Mitral Regurgitation     Angel Lyons is a 63 y.o. 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  He was last seen by Dr. Nancey, EP in 01/2023.  He has not seen general cardiology since his hospitalization in 08/2023 when he was found to have diastolic dysfunction on echocardiogram, along with at least moderate mitral regurgitation.  He had a complex tachycardia which was unclear if SVT or atrial flutter.  On EP study, there was no reentrant tachycardia found.  Therefore, he underwent atrial flutter ablation with no significant recurrence since then.  Recently, he has noticed occasional palpitation symptoms and also reports exertional dyspnea with more than usual activity.  He denies any chest pain, orthopnea, PND, leg edema symptoms.     Vitals:   12/26/23 1339  BP: 120/84  Pulse: (!) 59  SpO2: 96%      Review of Systems  Cardiovascular:  Positive for dyspnea on exertion and palpitations. Negative for chest pain, leg swelling and syncope.        Studies Reviewed: SABRA       EKG 12/26/2023: Sinus rhythm 59 bpm Possible Inferior infarct , age undetermined When compared with ECG of 17-Jan-2023 11:20, No significant change was found    Labs 1-12/2022: Chol 170, TG 62, HDL 36, LDL 122 Lp(a) 198 Hb 14.3 Cr 1.39   Flutter ablation 12/2022:  Echocardiogram 08/2022:  1. Left ventricular ejection fraction, by estimation, is 55%. The left  ventricle has normal function. The left ventricle has no regional wall  motion abnormalities. Left ventricular diastolic parameters are consistent  with Grade II diastolic dysfunction   (pseudonormalization). The average left ventricular global longitudinal  strain is -21.8 %. The global longitudinal strain is normal.   2. Right ventricular systolic function is normal. The right ventricular  size is normal. There is normal pulmonary artery systolic pressure. The  estimated right ventricular systolic pressure is 24.9 mmHg.   3. Left atrial size was mildly dilated.   4. Right atrial size was mildly dilated.   5. The mitral valve is abnormal. There appears to be mild prolapse of the  posterior leaflet, no definite flail segment visualized. Moderate  eccentric, anteriorly-directed mitral valve regurgitation but there is  splay artifact noted so possible that MR  could be worse. No evidence of mitral stenosis.   6. The aortic valve is tricuspid. There is mild calcification of the  aortic valve. Aortic valve regurgitation is not visualized.   7. Aortic dilatation noted. There is mild dilatation of the ascending  aorta, measuring 40 mm.   8. The inferior vena cava is normal in size with greater than 50%  respiratory variability, suggesting right atrial pressure of 3 mmHg.   [facility] 07/2022:   Mid RCA lesion is 10% stenosed.   Dist RCA lesion is 15% stenosed.   LV end diastolic pressure is mildly elevated.   Hemodynamic findings consistent with mild pulmonary hypertension.   There is no aortic valve stenosis.   POST-OPERATIVE DIAGNOSIS:   Angiographically minimal Mild pulm hypertension with PAP-mean 2 49/9 -9 mmHg.  PCWP 19 mmHg with a large V wave  to 33 mmHg LVEDP 17 mmHg.    Physical Exam Vitals and nursing note reviewed.  Constitutional:      General: He is not in acute distress. Neck:     Vascular: No JVD.   Cardiovascular:     Rate and Rhythm: Normal rate and regular rhythm.     Heart sounds: Murmur heard.     High-pitched blowing holosystolic murmur is present with a grade of 3/6 at the apex.  Pulmonary:     Effort: Pulmonary effort is normal.     Breath  sounds: Normal breath sounds. No wheezing or rales.   Musculoskeletal:     Right lower leg: No edema.     Left lower leg: No edema.      VISIT DIAGNOSES:   ICD-10-CM   1. Exertional dyspnea  R06.09 Pro b natriuretic peptide (BNP)    Basic metabolic panel with GFR    Basic metabolic panel with GFR    Pro b natriuretic peptide (BNP)    2. Nonrheumatic mitral valve regurgitation  I34.0 ECHOCARDIOGRAM COMPLETE    3. Palpitations  R00.2 CARDIAC EVENT MONITOR    4. Mixed hyperlipidemia  E78.2 EKG 12-Lead    Lipid panel    Lipid panel    5. Atrial flutter, unspecified type (HCC)  I48.92 EKG 12-Lead       Angel Lyons is a 63 y.o. male with h/o atrial flutter ablation, mitral regurgitation, HFpEF  Assessment & Plan  Palpitations: Narrow complex tachycardia in 08/2023, no reentrant tachycardia found on EP study, underwent flutter ablation. Recommend 30-day event monitor to evaluate for any recurrence.  Mitral regurgitation: At least moderate mitral regurgitation noted on echocardiogram in 08/2023, etiology unclear.  He still has loud mid-to-late systolic murmur.  Check echocardiogram and proBNP.  Depending on echocardiogram findings, he will likely need further workup for his mitral regurgitation, including possible TEE and heart catheterization. Refilled spironolactone  and Jardiance  given HFpEF in the setting of mitral regurgitation.  Mixed hyperlipidemia: Check lipid panel        Meds ordered this encounter  Medications   DISCONTD: nitroGLYCERIN  (NITROSTAT ) 0.4 MG SL tablet    Sig: Place 1 tablet (0.4 mg total) under the tongue every 5 (five) minutes x 3 doses as needed for up to 60 doses for chest pain.    Dispense:  30 tablet    Refill:  2   DISCONTD: spironolactone  (ALDACTONE ) 25 MG tablet    Sig: Take 1 tablet (25 mg total) by mouth daily.    Dispense:  90 tablet    Refill:  3   DISCONTD: empagliflozin  (JARDIANCE ) 10 MG TABS tablet    Sig: Take 1 tablet (10  mg total) by mouth daily.    Dispense:  30 tablet    Refill:  3   spironolactone  (ALDACTONE ) 25 MG tablet    Sig: Take 1/2 tablet (12.5 mg total) by mouth daily.    Dispense:  60 tablet    Refill:  3   empagliflozin  (JARDIANCE ) 10 MG TABS tablet    Sig: Take 1 tablet (10 mg total) by mouth daily.    Dispense:  90 tablet    Refill:  3     F/u in 3 months  Signed, Newman JINNY Lawrence, MD

## 2023-12-27 ENCOUNTER — Ambulatory Visit: Payer: Self-pay | Admitting: Cardiology

## 2023-12-27 DIAGNOSIS — E782 Mixed hyperlipidemia: Secondary | ICD-10-CM

## 2023-12-27 LAB — BASIC METABOLIC PANEL WITH GFR
BUN/Creatinine Ratio: 10 (ref 10–24)
BUN: 13 mg/dL (ref 8–27)
CO2: 21 mmol/L (ref 20–29)
Calcium: 9.7 mg/dL (ref 8.6–10.2)
Chloride: 106 mmol/L (ref 96–106)
Creatinine, Ser: 1.3 mg/dL — ABNORMAL HIGH (ref 0.76–1.27)
Glucose: 97 mg/dL (ref 70–99)
Potassium: 4.4 mmol/L (ref 3.5–5.2)
Sodium: 142 mmol/L (ref 134–144)
eGFR: 61 mL/min/{1.73_m2} (ref >59)

## 2023-12-27 LAB — LIPID PANEL
Chol/HDL Ratio: 5.1 ratio — ABNORMAL HIGH (ref 0.0–5.0)
Cholesterol, Total: 223 mg/dL — ABNORMAL HIGH (ref 100–199)
HDL: 44 mg/dL (ref >39)
LDL Chol Calc (NIH): 164 mg/dL — ABNORMAL HIGH (ref 0–99)
Triglycerides: 82 mg/dL (ref 0–149)
VLDL Cholesterol Cal: 15 mg/dL (ref 5–40)

## 2023-12-27 LAB — PRO B NATRIURETIC PEPTIDE: NT-Pro BNP: 95 pg/mL (ref 0–376)

## 2023-12-27 MED ORDER — ROSUVASTATIN CALCIUM 20 MG PO TABS: 20.0000 mg | ORAL_TABLET | Freq: Every day | ORAL | 3 refills | Status: AC

## 2023-12-27 NOTE — Progress Notes (Signed)
 Kidney function is stable.  Cholesterol is high.  Recommend Crestor 20 mg daily, and repeat lipid panel in 3 months.  Thanks MJP

## 2024-01-18 DIAGNOSIS — Z0279 Encounter for issue of other medical certificate: Secondary | ICD-10-CM

## 2024-01-23 ENCOUNTER — Telehealth: Payer: Self-pay | Admitting: Cardiology

## 2024-01-23 NOTE — Telephone Encounter (Signed)
 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 Patient will need it back ASAP. Please call patient when it's ready. Will leave in provider box.

## 2024-01-23 NOTE — Telephone Encounter (Signed)
 Did this patient pay to have forms filled out?

## 2024-01-25 NOTE — Telephone Encounter (Signed)
 Spoke with the patient and advised that message will be sent to Dr. Gilda nurse. Patient would like the forms filled out by tomorrow. Advised that I am not sure that we will be able to get it done by then since he just dropped the forms off on Tuesday, but we will see what we can do.

## 2024-01-25 NOTE — Telephone Encounter (Signed)
Patient called to follow-up on the status of his paperwork.

## 2024-01-25 NOTE — Telephone Encounter (Signed)
 Yes. No restriction.   Thanks MJP

## 2024-01-25 NOTE — Telephone Encounter (Signed)
 Spoke with patient and he reports that the monitor never would stick to his skin, so he stopped wearing the monitor after the second day. Can we still complete paperwork.

## 2024-01-26 ENCOUNTER — Telehealth: Payer: Self-pay | Admitting: Cardiology

## 2024-01-26 NOTE — Telephone Encounter (Signed)
 Paperwork picked up.

## 2024-01-26 NOTE — Telephone Encounter (Signed)
 Completed [employer] form given to patient and scanned into chart.  Billing notified.

## 2024-02-05 ENCOUNTER — Other Ambulatory Visit: Payer: Self-pay | Admitting: Cardiology

## 2024-02-13 ENCOUNTER — Ambulatory Visit (HOSPITAL_COMMUNITY)

## 2024-03-19 ENCOUNTER — Ambulatory Visit (HOSPITAL_COMMUNITY)

## 2024-04-01 ENCOUNTER — Ambulatory Visit: Admitting: Cardiology

## 2024-04-30 ENCOUNTER — Telehealth (HOSPITAL_COMMUNITY): Payer: Self-pay | Admitting: Cardiology

## 2024-04-30 ENCOUNTER — Ambulatory Visit (HOSPITAL_COMMUNITY): Attending: Cardiology

## 2024-04-30 NOTE — Telephone Encounter (Signed)
 04/30/24 NO SHOWED ECHOCARDIOGRAM-COPIED MD- We will not reach out to patient to reschedule due to NO SHOW RATE and cancellations. [facility]  08/12 and 09/16 pt cancelled.   Order will be removed from the echo WQ.

## 2024-05-08 ENCOUNTER — Ambulatory Visit: Attending: Cardiology | Admitting: Cardiology

## 2024-07-04 ENCOUNTER — Other Ambulatory Visit: Payer: Self-pay | Admitting: Cardiology
